# Patient Record
Sex: Male | Born: 1995 | Race: Black or African American | Hispanic: No | Marital: Married | Smoking: Current some day smoker
Health system: Southern US, Community
[De-identification: ages and names within clinical notes are randomized; demographics above are authoritative.]

## PROBLEM LIST (undated history)

## (undated) HISTORY — PX: HERNIA REPAIR: SHX51

---
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
## 2015-05-01 ENCOUNTER — Emergency Department (HOSPITAL_COMMUNITY)

## 2015-05-01 ENCOUNTER — Emergency Department (HOSPITAL_COMMUNITY)
Admission: EM | Admit: 2015-05-01 | Discharge: 2015-05-01 | Disposition: A | Discharge status: Home / Self Care | Attending: Emergency Medicine | Admitting: Emergency Medicine

## 2015-05-01 ENCOUNTER — Encounter (HOSPITAL_COMMUNITY): Payer: Self-pay | Admitting: Emergency Medicine

## 2015-05-01 DIAGNOSIS — L03211 Cellulitis of face: Secondary | ICD-10-CM | POA: Insufficient documentation

## 2015-05-01 DIAGNOSIS — R220 Localized swelling, mass and lump, head: Secondary | ICD-10-CM | POA: Diagnosis present

## 2015-05-01 LAB — CBC WITH DIFFERENTIAL/PLATELET
BASOS ABS: 0 10*3/uL (ref 0.0–0.1)
BASOS PCT: 0 %
Eosinophils Absolute: 0.2 10*3/uL (ref 0.0–0.7)
Eosinophils Relative: 3 %
HEMATOCRIT: 43.6 % (ref 39.0–52.0)
HEMOGLOBIN: 14.9 g/dL (ref 13.0–17.0)
LYMPHS PCT: 27 %
Lymphs Abs: 2.1 10*3/uL (ref 0.7–4.0)
MCH: 30.2 pg (ref 26.0–34.0)
MCHC: 34.2 g/dL (ref 30.0–36.0)
MCV: 88.4 fL (ref 78.0–100.0)
MONO ABS: 0.8 10*3/uL (ref 0.1–1.0)
MONOS PCT: 11 %
NEUTROS ABS: 4.6 10*3/uL (ref 1.7–7.7)
NEUTROS PCT: 59 %
PLATELETS: 280 10*3/uL (ref 150–400)
RBC: 4.93 MIL/uL (ref 4.22–5.81)
RDW: 12.3 % (ref 11.5–15.5)
WBC: 7.9 10*3/uL (ref 4.0–10.5)

## 2015-05-01 LAB — BASIC METABOLIC PANEL
ANION GAP: 9 (ref 5–15)
BUN: 14 mg/dL (ref 6–20)
CALCIUM: 9.6 mg/dL (ref 8.9–10.3)
CO2: 25 mmol/L (ref 22–32)
Chloride: 104 mmol/L (ref 101–111)
Creatinine, Ser: 1.14 mg/dL (ref 0.61–1.24)
GLUCOSE: 107 mg/dL — AB (ref 65–99)
POTASSIUM: 4 mmol/L (ref 3.5–5.1)
Sodium: 138 mmol/L (ref 135–145)

## 2015-05-01 MED ORDER — IOHEXOL 300 MG/ML  SOLN
100.0000 mL | Freq: Once | INTRAMUSCULAR | Status: AC | PRN
  Administered 2015-05-01: 100 mL via INTRAVENOUS

## 2015-05-01 MED ORDER — SODIUM CHLORIDE 0.9 % IV SOLN
INTRAVENOUS | Status: DC
  Administered 2015-05-01: 06:00:00 via INTRAVENOUS

## 2015-05-01 MED ORDER — CLINDAMYCIN PHOSPHATE 600 MG/50ML IV SOLN
600.0000 mg | Freq: Once | INTRAVENOUS | Status: AC
  Administered 2015-05-01: 600 mg via INTRAVENOUS
  Filled 2015-05-01: qty 50

## 2015-05-01 MED ORDER — CLINDAMYCIN HCL 300 MG PO CAPS
300.0000 mg | ORAL_CAPSULE | Freq: Four times a day (QID) | ORAL | Status: DC
Start: 2015-05-01 — End: 2020-06-09

## 2015-05-01 NOTE — Discharge Instructions (Signed)
Use heat on the area that is swollen. Take the antibiotics until gone. You can take ibuprofen 600 mg + acetaminophen 650 mg 4 times a day for pain or fever. Recheck if you aren't improving in the next 48 hrs or if you get worse (vomiting, high fever, trouble breathing or swallowing).    Cellulitis Cellulitis is an infection of the skin and the tissue under the skin. The infected area is usually red and tender. This happens most often in the arms and lower legs. HOME CARE   Take your antibiotic medicine as told. Finish the medicine even if you start to feel better.  Keep the infected arm or leg raised (elevated).  Put a warm cloth on the area up to 4 times per day.  Only take medicines as told by your doctor.  Keep all doctor visits as told. GET HELP IF:  You see red streaks on the skin coming from the infected area.  Your red area gets bigger or turns a dark color.  Your bone or joint under the infected area is painful after the skin heals.  Your infection comes back in the same area or different area.  You have a puffy (swollen) bump in the infected area.  You have new symptoms.  You have a fever. GET HELP RIGHT AWAY IF:   You feel very sleepy.  You throw up (vomit) or have watery poop (diarrhea).  You feel sick and have muscle aches and pains.   This information is not intended to replace advice given to you by your health care provider. Make sure you discuss any questions you have with your health care provider.   Document Released: 11/19/2007 Document Revised: 02/21/2015 Document Reviewed: 08/18/2011 Elsevier Interactive Patient Education [employer]2016 Elsevier Inc.

## 2015-05-01 NOTE — ED Notes (Signed)
Pt has an abscess to the left side of his face causing swelling around his left eye  Pt states it started 2 days ago and is getting worse

## 2015-05-01 NOTE — ED Notes (Signed)
MD at bedside. 

## 2015-05-01 NOTE — ED Notes (Signed)
Bed: UX32WA05 Expected date:  Expected time:  Means of arrival:  Comments: Magar

## 2015-05-01 NOTE — ED Provider Notes (Signed)
CSN: [id number]     Arrival date & time 05/01/15  0415 History   First MD Initiated Contact with Patient 05/01/15 0530     Chief Complaint  Patient presents with  . Facial Swelling     (Consider location/radiation/quality/duration/timing/severity/associated sxs/prior Treatment) HPI  Patient reports 3-4 days ago he had a small pimple on the left side of his face that has gotten progressively worse. He states at first he thought it was insect bite but denies that it has not had any itching. He states his morning he started having a lot of swelling involving his eye and states it's painful when he moves his eyes. He is unsure of fever and has a slight headache. He denies any prior history of boils or abscesses. He denies anybody in his family having a boil or abscess.  PCP Dr Marlyne Beards  History reviewed. No pertinent past medical history. Past Surgical History  Procedure Laterality Date  . Hernia repair     Family History  Problem Relation Age of Onset  . Hypertension Other   . Diabetes Other   . CAD Other    Social History  Substance Use Topics  . Smoking status: Never Smoker   . Smokeless tobacco: Never Used  . Alcohol Use: No  employed  Review of Systems  All other systems reviewed and are negative.     Allergies  Review of patient's allergies indicates no known allergies.  Home Medications   Prior to Admission medications   Medication Sig Start Date End Date Taking? Authorizing Provider  Witch Hazel 14 % LIQD Apply 1 application topically once.   Yes Historical Provider, MD  azithromycin (ZITHROMAX) 250 MG tablet Take 1 tablet (250 mg total) by mouth daily. Take first 2 tablets together, then 1 every day until finished. Patient not taking: Reported on 05/01/2015 06/15/14   [name], PA-C  clindamycin (CLEOCIN) 300 MG capsule Take 1 capsule (300 mg total) by mouth 4 (four) times daily. 05/01/15   [name], MD  guaiFENesin-codeine 100-10 MG/5ML syrup Take 5-10  mLs by mouth 3 (three) times daily as needed for cough. Patient not taking: Reported on 05/01/2015 06/15/14   [name], PA-C  ibuprofen (ADVIL,MOTRIN) 600 MG tablet Take 1 tablet (600 mg total) by mouth every 6 (six) hours as needed for mild pain. Patient not taking: Reported on 05/01/2015 09/25/13   Marcellina Millin, MD  phenol (CHLORASEPTIC) 1.4 % LIQD Use as directed 1 spray in the mouth or throat as needed for throat irritation / pain. Patient not taking: Reported on 05/01/2015 10/25/14   [name], PA-C   BP 109/70 mmHg  Pulse 62  Temp(Src) 98.1 F (36.7 C) (Oral)  Resp 20  SpO2 100%    Vital signs normal   Physical Exam  Constitutional: He is oriented to person, place, and time. He appears well-developed and well-nourished.  Non-toxic appearance. He does not appear ill. No distress.  HENT:  Head: Normocephalic and atraumatic.  Right Ear: External ear normal.  Left Ear: External ear normal.  Nose: Nose normal. No mucosal edema or rhinorrhea.  Mouth/Throat: Oropharynx is clear and moist and mucous membranes are normal. No dental abscesses or uvula swelling.  Patient is noted to have moderate swelling on the left lateral portion of his face that extends to his left upper and more on his left lower eyelids. We do range of motion of his eyes he states it causes pain. The tissue underneath the swelling is firm to palpation.  Eyes: Conjunctivae and EOM are normal.  Neck: Normal range of motion and full passive range of motion without pain.  Pulmonary/Chest: Effort normal. No respiratory distress. He has no rhonchi. He exhibits no crepitus.  Abdominal: Normal appearance.  Musculoskeletal: Normal range of motion.  Moves all extremities well.   Neurological: He is alert and oriented to person, place, and time. He has normal strength. No cranial nerve deficit.  Skin: Skin is warm, dry and intact. No rash noted. No erythema. No pallor.  Psychiatric: He has a normal mood and affect.  His speech is normal and behavior is normal. His mood appears not anxious.  Nursing note and vitals reviewed.        ED Course  Procedures (including critical care time)   Medications  0.9 %  sodium chloride infusion ( Intravenous New Bag/Given 05/01/15 0612)  clindamycin (CLEOCIN) IVPB 600 mg (not administered)  iohexol (OMNIPAQUE) 300 MG/ML solution 100 mL (100 mLs Intravenous Contrast Given 05/01/15 0633)    CT of patient's face was done to look for abscess. Also concerned about. Orbital cellulitis. Patient refused pain medication at this time.  7:40 AM patient's CT scan was reviewed. He was started on IV clindamycin for his facial cellulitis.   Labs Review Results for orders placed or performed during the hospital encounter of 05/01/15  Basic metabolic panel  Result Value Ref Range   Sodium 138 135 - 145 mmol/L   Potassium 4.0 3.5 - 5.1 mmol/L   Chloride 104 101 - 111 mmol/L   CO2 25 22 - 32 mmol/L   Glucose, Bld 107 (H) 65 - 99 mg/dL   BUN 14 6 - 20 mg/dL   Creatinine, Ser 1.61 0.61 - 1.24 mg/dL   Calcium 9.6 8.9 - 09.6 mg/dL   GFR calc non Af Amer >60 >60 mL/min   GFR calc Af Amer >60 >60 mL/min   Anion gap 9 5 - 15  CBC with Differential  Result Value Ref Range   WBC 7.9 4.0 - 10.5 K/uL   RBC 4.93 4.22 - 5.81 MIL/uL   Hemoglobin 14.9 13.0 - 17.0 g/dL   HCT 04.5 40.9 - 81.1 %   MCV 88.4 78.0 - 100.0 fL   MCH 30.2 26.0 - 34.0 pg   MCHC 34.2 30.0 - 36.0 g/dL   RDW 91.4 78.2 - 95.6 %   Platelets 280 150 - 400 K/uL   Neutrophils Relative % 59 %   Neutro Abs 4.6 1.7 - 7.7 K/uL   Lymphocytes Relative 27 %   Lymphs Abs 2.1 0.7 - 4.0 K/uL   Monocytes Relative 11 %   Monocytes Absolute 0.8 0.1 - 1.0 K/uL   Eosinophils Relative 3 %   Eosinophils Absolute 0.2 0.0 - 0.7 K/uL   Basophils Relative 0 %   Basophils Absolute 0.0 0.0 - 0.1 K/uL   Laboratory interpretation all normal     Imaging Review Ct Maxillofacial W/cm  05/01/2015  CLINICAL DATA:   19 year old male with left-sided facial swelling for the past 2 days. Eye pain, increasing with movement. EXAM: CT MAXILLOFACIAL WITH CONTRAST TECHNIQUE: Multidetector CT imaging of the maxillofacial structures was performed with intravenous contrast. Multiplanar CT image reconstructions were also generated. A small metallic BB was placed on the right temple in order to reliably differentiate right from left. CONTRAST:  OMNIPAQUE IOHEXOL 300 MG/ML  SOLN COMPARISON:  No priors. FINDINGS: Extensive soft tissue swelling is noted overlying the left zygomatic arch. The skin and subcutaneous fat appear  diffusely thickened throughout this region measuring up to 1.2 cm in thickness (compared with only 4 mm on the contralateral side). No discrete focal fluid collection is identified to suggest an underlying abscess at this time. The deep soft tissues of the face are normal in appearance. Specifically, no gas is identified within the deep soft tissues of the face, or in the area of thickening of the subcutaneous fat, to suggest underlying fasciitis at this time. Bilateral globes and retro-orbital soft tissues are grossly normal in appearance. No lymphadenopathy is noted in the visualized portions of the head or neck. Visualized intracranial contents are normal in appearance. No acute displaced facial bone fractures are noted. Specifically, specifically, pterygoid plates are intact. Mandible is intact, and the mandibular condyles are located bilaterally. Both zygomatic arches are intact. Visualized portions of the paranasal sinuses and mastoids are well pneumatized. IMPRESSION: 1. Extensive thickening of the scan and subcutaneous fat overlying the left zygomatic arch presumably represents cellulitis. No evidence of underlying abscess, and no soft tissue gas to suggest underlying fasciitis at this time. Electronically Signed   By: Trudie Reedaniel  Entrikin M.D.   On: 05/01/2015 07:14   I have personally reviewed and evaluated  these images and lab results as part of my medical decision-making.    MDM   Final diagnoses:  Facial cellulitis    New Prescriptions   CLINDAMYCIN (CLEOCIN) 300 MG CAPSULE    Take 1 capsule (300 mg total) by mouth 4 (four) times daily.    Plan discharge   Devoria AlbeIva Lawerence Dery, MD, Concha PyoFACEP     Braeton Wolgamott, MD 05/01/15 [phone]

## 2016-06-25 ENCOUNTER — Encounter (HOSPITAL_COMMUNITY): Payer: Self-pay | Admitting: Emergency Medicine

## 2016-06-25 ENCOUNTER — Emergency Department (HOSPITAL_COMMUNITY)
Admission: EM | Admit: 2016-06-25 | Discharge: 2016-06-25 | Disposition: A | Discharge status: Home / Self Care | Attending: Physician Assistant | Admitting: Physician Assistant

## 2016-06-25 ENCOUNTER — Emergency Department (HOSPITAL_COMMUNITY)

## 2016-06-25 DIAGNOSIS — B9789 Other viral agents as the cause of diseases classified elsewhere: Secondary | ICD-10-CM

## 2016-06-25 DIAGNOSIS — J069 Acute upper respiratory infection, unspecified: Secondary | ICD-10-CM | POA: Insufficient documentation

## 2016-06-25 DIAGNOSIS — J029 Acute pharyngitis, unspecified: Secondary | ICD-10-CM | POA: Diagnosis present

## 2016-06-25 MED ORDER — BENZONATATE 100 MG PO CAPS: 100.0000 mg | ORAL_CAPSULE | Freq: Three times a day (TID) | ORAL | 0 refills | Status: DC

## 2016-06-25 NOTE — Discharge Instructions (Signed)
Please read attached information. If you experience any new or worsening signs or symptoms please return to the emergency room for evaluation. Please follow-up with your primary care provider or specialist as discussed. Please use medication prescribed only as directed and discontinue taking if you have any concerning signs or symptoms.   °

## 2016-06-25 NOTE — ED Triage Notes (Addendum)
Patient reports sore throat and congestion x 2 weeks.  Reports productive cough and headaches, denies fever.  States taking sudafed and tylenol cold and flu without relief.

## 2016-06-25 NOTE — ED Notes (Signed)
ED Provider at bedside. 

## 2016-06-25 NOTE — ED Provider Notes (Signed)
[facility]-EMERGENCY DEPT Provider Note   CSN: [id number] Arrival date & time: 06/25/16  1053  By signing my name below, I, Vista Mink, attest that this documentation has been prepared under the direction and in the presence of [employer].  Electronically Signed: Vista Mink, ED Scribe. 06/25/16. 10:59 AM.  History   Chief Complaint Chief Complaint  Patient presents with  . Sore Throat   HPI HPI Comments: Philip Baird is a 21 y.o. male, with no significant Hx, who presents to the Emergency Department complaining of worsening sore throat with associated rhinorrhea and congestion that started two weeks ago. He also reports some mild pain in his sinuses and a productive cough. Pt also notes intermittent central chest pain brought on when coughing but also states that this occurs randomly. He has taken Tylenol and Sudafed with no relief. Not a current smoker. He denies any lower extremity swelling. No ear pain. No shortness of breath or fever.   The history is provided by the patient. No language interpreter was used.    History reviewed. No pertinent past medical history.  There are no active problems to display for this patient.   Past Surgical History:  Procedure Laterality Date  . HERNIA REPAIR      Home Medications    Prior to Admission medications   Medication Sig Start Date End Date Taking? Authorizing Provider  azithromycin (ZITHROMAX) 250 MG tablet Take 1 tablet (250 mg total) by mouth daily. Take first 2 tablets together, then 1 every day until finished. Patient not taking: Reported on 05/01/2015 06/15/14   [name], PA-C  benzonatate (TESSALON) 100 MG capsule Take 1 capsule (100 mg total) by mouth every 8 (eight) hours. 06/25/16   Eyvonne Mechanic, PA-C  clindamycin (CLEOCIN) 300 MG capsule Take 1 capsule (300 mg total) by mouth 4 (four) times daily. 05/01/15   [name], MD  guaiFENesin-codeine 100-10 MG/5ML syrup Take 5-10 mLs by mouth 3 (three) times daily  as needed for cough. Patient not taking: Reported on 05/01/2015 06/15/14   [name], PA-C  ibuprofen (ADVIL,MOTRIN) 600 MG tablet Take 1 tablet (600 mg total) by mouth every 6 (six) hours as needed for mild pain. Patient not taking: Reported on 05/01/2015 09/25/13   Marcellina Millin, MD  phenol (CHLORASEPTIC) 1.4 % LIQD Use as directed 1 spray in the mouth or throat as needed for throat irritation / pain. Patient not taking: Reported on 05/01/2015 10/25/14   [name], PA-C  Witch Hazel 14 % LIQD Apply 1 application topically once.    Historical Provider, MD   Family History Family History  Problem Relation Age of Onset  . Hypertension Other   . Diabetes Other   . CAD Other    Social History Social History  Substance Use Topics  . Smoking status: Never Smoker  . Smokeless tobacco: Never Used  . Alcohol use No   Allergies   Patient has no known allergies.   Review of Systems Review of Systems A complete 10 system review of systems was obtained and all systems are negative except as noted in the HPI and PMH.    Physical Exam Updated Vital Signs BP 132/85 (BP Location: Left Arm)   Pulse 64   Temp 98.2 F (36.8 C) (Oral)   Resp 18   Ht 5' 11.5" (1.816 m)   Wt 210 lb (95.3 kg)   SpO2 97%   BMI 28.88 kg/m   Physical Exam  Constitutional: He is oriented to person,  place, and time. He appears well-developed and well-nourished. No distress.  HENT:  Head: Normocephalic and atraumatic.  Right Ear: Tympanic membrane, external ear and ear canal normal.  Left Ear: Tympanic membrane, external ear and ear canal normal.  Mouth/Throat: Oropharynx is clear and moist. No oropharyngeal exudate.  Rhinorrhea.   Neck: Normal range of motion.  Pulmonary/Chest: Effort normal and breath sounds normal.  Lymphadenopathy:    He has no cervical adenopathy.  Neurological: He is alert and oriented to person, place, and time.  Skin: Skin is warm and dry. He is not diaphoretic.    Psychiatric: He has a normal mood and affect. Judgment normal.  Nursing note and vitals reviewed.   ED Treatments / Results  DIAGNOSTIC STUDIES: Oxygen Saturation is 97% on RA, normal by my interpretation.  COORDINATION OF CARE: 11:17 AM-Discussed treatment plan with pt at bedside and pt agreed to plan.   Labs (all labs ordered are listed, but only abnormal results are displayed) Labs Reviewed - No data to display  EKG  EKG Interpretation None      Radiology Dg Chest 2 View  Result Date: 06/25/2016 CLINICAL DATA:  Cough, congestion and mid chest pain for 2 weeks. EXAM: CHEST  2 VIEW COMPARISON:  09/09/2012 FINDINGS: The heart size and mediastinal contours are within normal limits. Both lungs are clear. The visualized skeletal structures are unremarkable. IMPRESSION: No active cardiopulmonary disease. Electronically Signed   By: Charlett NoseKevin  Dover M.D.   On: 06/25/2016 12:26    Procedures Procedures (including critical care time)  Medications Ordered in ED Medications - No data to display   Initial Impression / Assessment and Plan / ED Course  I have reviewed the triage vital signs and the nursing notes.  Pertinent labs & imaging results that were available during my care of the patient were reviewed by me and considered in my medical decision making (see chart for details).  Clinical Course     Patient presents with likely viral URI. He has no signs of acute bacterial infection. He will be treated with cough medication, symptomatic care instructions and strict return precautions. He verbalizes understanding and agreement to today's plan had no further questions or concerns.  Final Clinical Impressions(s) / ED Diagnoses   Final diagnoses:  Viral URI with cough    New Prescriptions New Prescriptions   BENZONATATE (TESSALON) 100 MG CAPSULE    Take 1 capsule (100 mg total) by mouth every 8 (eight) hours.    I personally performed the services described in this  documentation, which was scribed in my presence. The recorded information has been reviewed and is accurate.     Eyvonne MechanicJeffrey Anvay Tennis, PA-C 06/25/16 1300    Courteney Randall AnLyn Mackuen, MD 06/25/16 1639

## 2016-07-08 ENCOUNTER — Emergency Department (HOSPITAL_COMMUNITY)
Admission: EM | Admit: 2016-07-08 | Discharge: 2016-07-08 | Disposition: A | Discharge status: Home / Self Care | Attending: Emergency Medicine | Admitting: Emergency Medicine

## 2016-07-08 ENCOUNTER — Encounter (HOSPITAL_COMMUNITY): Payer: Self-pay | Admitting: Emergency Medicine

## 2016-07-08 DIAGNOSIS — Y999 Unspecified external cause status: Secondary | ICD-10-CM | POA: Insufficient documentation

## 2016-07-08 DIAGNOSIS — M545 Low back pain, unspecified: Secondary | ICD-10-CM

## 2016-07-08 DIAGNOSIS — X58XXXA Exposure to other specified factors, initial encounter: Secondary | ICD-10-CM | POA: Insufficient documentation

## 2016-07-08 DIAGNOSIS — Y9367 Activity, basketball: Secondary | ICD-10-CM | POA: Diagnosis not present

## 2016-07-08 DIAGNOSIS — Y929 Unspecified place or not applicable: Secondary | ICD-10-CM | POA: Insufficient documentation

## 2016-07-08 NOTE — Discharge Instructions (Signed)
Please read attached information. If you experience any new or worsening signs or symptoms please return to the emergency room for evaluation. Please follow-up with your primary care provider or specialist as discussed.  °

## 2016-07-08 NOTE — ED Provider Notes (Signed)
[facility] Provider Note   CSN: [id number] Arrival date & time: 07/08/16  1450  By signing my name below, I, Philip Baird, attest that this documentation has been prepared under the direction and in the presence of [employer], PA-C . Electronically Signed: Majel Baird, Scribe. 07/08/2016. 5:47 PM.  History   Chief Complaint Chief Complaint  Patient presents with  . Back Pain   The history is provided by the patient. No language interpreter was used.   HPI Comments: Philip Baird is a 21 y.o. male who presents to the Emergency Department complaining of gradually worsening, "aching" lower back pain that began this afternoon. Pt reports he was playing basketball this afternoon and noticed pain in his lower back soon afterwards. He denies any injury or trauma to his back, numbness or weakness in his extremities, and fever.   History reviewed. No pertinent past medical history.  There are no active problems to display for this patient.  Past Surgical History:  Procedure Laterality Date  . HERNIA REPAIR      Home Medications    Prior to Admission medications   Medication Sig Start Date End Date Taking? Authorizing Provider  azithromycin (ZITHROMAX) 250 MG tablet Take 1 tablet (250 mg total) by mouth daily. Take first 2 tablets together, then 1 every day until finished. Patient not taking: Reported on 05/01/2015 06/15/14   [name], PA-C  benzonatate (TESSALON) 100 MG capsule Take 1 capsule (100 mg total) by mouth every 8 (eight) hours. 06/25/16   Eyvonne Mechanic, PA-C  clindamycin (CLEOCIN) 300 MG capsule Take 1 capsule (300 mg total) by mouth 4 (four) times daily. 05/01/15   [name], MD  guaiFENesin-codeine 100-10 MG/5ML syrup Take 5-10 mLs by mouth 3 (three) times daily as needed for cough. Patient not taking: Reported on 05/01/2015 06/15/14   [name], PA-C  ibuprofen (ADVIL,MOTRIN) 600 MG tablet Take 1 tablet (600 mg total) by mouth every 6 (six) hours as  needed for mild pain. Patient not taking: Reported on 05/01/2015 09/25/13   Marcellina Millin, MD  phenol (CHLORASEPTIC) 1.4 % LIQD Use as directed 1 spray in the mouth or throat as needed for throat irritation / pain. Patient not taking: Reported on 05/01/2015 10/25/14   [name], PA-C  Witch Hazel 14 % LIQD Apply 1 application topically once.    Historical Provider, MD    Family History Family History  Problem Relation Age of Onset  . Hypertension Other   . Diabetes Other   . CAD Other     Social History Social History  Substance Use Topics  . Smoking status: Never Smoker  . Smokeless tobacco: Never Used  . Alcohol use No   Allergies   Patient has no known allergies.  Review of Systems Review of Systems  Constitutional: Negative for fever.  Musculoskeletal: Positive for back pain.  Neurological: Negative for weakness and numbness.   Physical Exam Updated Vital Signs BP 117/80 (BP Location: Left Arm)   Pulse 99   Temp 98 F (36.7 C) (Oral)   Resp 18   SpO2 99%   Physical Exam  Constitutional: He is oriented to person, place, and time. He appears well-developed and well-nourished.  HENT:  Head: Normocephalic.  Eyes: EOM are normal.  Neck: Normal range of motion.  Pulmonary/Chest: Effort normal.  Abdominal: He exhibits no distension.  Musculoskeletal: Normal range of motion.  No CT or L-spine tenderness, back atraumatic, no tenderness palpation. Full active strength of upper and lower extremities,  strength intact.  Neurological: He is alert and oriented to person, place, and time.  Psychiatric: He has a normal mood and affect.  Nursing note and vitals reviewed.  ED Treatments / Results  DIAGNOSTIC STUDIES:  Oxygen Saturation is 99% on RA, normal by my interpretation.    COORDINATION OF CARE:  4:51 PM Discussed treatment plan with pt at bedside and pt agreed to plan.  Radiology No results found.  Procedures Procedures (including critical care  time)  Medications Ordered in ED Medications - No data to display  Initial Impression / Assessment and Plan / ED Course  I have reviewed the triage vital signs and the nursing notes.  Pertinent labs & imaging results that were available during my care of the patient were reviewed by me and considered in my medical decision making (see chart for details).  Labs:   Imaging:   Consults:   Therapeutics:   Discharge Meds:  Assessment/Plan:   21 year old male with unconjugated back pain. No neurological deficits. Patient given symptomatic care instructions return precautions.  I personally performed the services described in this documentation, which was scribed in my presence. The recorded information has been reviewed and is accurate..   Final Clinical Impressions(s) / ED Diagnoses   Final diagnoses:  Acute bilateral low back pain without sciatica    New Prescriptions New Prescriptions   No medications on file     Eyvonne MechanicJeffrey Corben Auzenne, PA-C 07/08/16 1747    Shaune Pollackameron Isaacs, MD 07/10/16 [phone]

## 2016-07-08 NOTE — ED Triage Notes (Signed)
Patient reports worsening lower back pain after playing basketball today. Denies fall/ injury. Ambulatory to triage. Denies numbness/tingling, bowel/bladder changes.

## 2017-01-28 ENCOUNTER — Encounter (HOSPITAL_COMMUNITY): Payer: Self-pay | Admitting: Emergency Medicine

## 2017-01-28 ENCOUNTER — Emergency Department (HOSPITAL_COMMUNITY)
Admission: EM | Admit: 2017-01-28 | Discharge: 2017-01-28 | Disposition: A | Discharge status: Home / Self Care | Attending: Emergency Medicine | Admitting: Emergency Medicine

## 2017-01-28 ENCOUNTER — Emergency Department (HOSPITAL_COMMUNITY)

## 2017-01-28 DIAGNOSIS — R0789 Other chest pain: Secondary | ICD-10-CM | POA: Diagnosis not present

## 2017-01-28 LAB — BASIC METABOLIC PANEL
Anion gap: 8 (ref 5–15)
BUN: 15 mg/dL (ref 6–20)
CHLORIDE: 102 mmol/L (ref 101–111)
CO2: 28 mmol/L (ref 22–32)
CREATININE: 1.17 mg/dL (ref 0.61–1.24)
Calcium: 9.5 mg/dL (ref 8.9–10.3)
Glucose, Bld: 94 mg/dL (ref 65–99)
POTASSIUM: 4.3 mmol/L (ref 3.5–5.1)
SODIUM: 138 mmol/L (ref 135–145)

## 2017-01-28 LAB — CBC
HEMATOCRIT: 44.1 % (ref 39.0–52.0)
Hemoglobin: 15.2 g/dL (ref 13.0–17.0)
MCH: 29.6 pg (ref 26.0–34.0)
MCHC: 34.5 g/dL (ref 30.0–36.0)
MCV: 85.8 fL (ref 78.0–100.0)
PLATELETS: 281 10*3/uL (ref 150–400)
RBC: 5.14 MIL/uL (ref 4.22–5.81)
RDW: 12.7 % (ref 11.5–15.5)
WBC: 5 10*3/uL (ref 4.0–10.5)

## 2017-01-28 LAB — POCT I-STAT TROPONIN I: Troponin i, poc: 0 ng/mL (ref 0.00–0.08)

## 2017-01-28 MED ORDER — IBUPROFEN 800 MG PO TABS: 800.0000 mg | ORAL_TABLET | Freq: Three times a day (TID) | ORAL | 0 refills | Status: DC

## 2017-01-28 NOTE — Discharge Instructions (Signed)
Please read attached information. If you experience any new or worsening signs or symptoms please return to the emergency room for evaluation. Please follow-up with your primary care provider or specialist as discussed. Please use medication prescribed only as directed and discontinue taking if you have any concerning signs or symptoms.   °

## 2017-01-28 NOTE — ED Notes (Signed)
Pt ambulatory and independent at discharge.  Verbalized understanding of discharge instructions 

## 2017-01-28 NOTE — ED Triage Notes (Signed)
Pt c/o intermittent sharp central and left chest pain onset last night. No SOB, dizziness, nausea, blurred vision, arm/neck pain or tingling. No aggravating or alleviating factors. Not related to activity or ROM. Not reproducible with palpation or ROM.

## 2017-01-28 NOTE — ED Provider Notes (Signed)
[facility] DEPT Provider Note   CSN: [id number] Arrival date & time: 01/28/17  1145     History   Chief Complaint Chief Complaint  Patient presents with  . Chest Pain    HPI Philip Baird is a 21 y.o. male.  HPI   21 year old male presents today with complaints of chest pain.  Patient notes since Monday he has had sharp intermittent chest pain.  He notes this is in his sternum with radiation to the left side.  He notes this comes out of the blue lasts approximately 30 seconds and then resolves without comp occasion.  He denies any associated shortness of breath, nausea, abdominal pain, dizziness.  Patient denies any preceding illnesses including fever, or any other infectious etiology.  Patient reports that symptoms are not made worse with any movements including deep breaths or palpation of the chest.  Patient reports he had one episode prior to my evaluation, but the time of evaluation is completely asymptomatic.  Patient denies any risk factors for DVT or PE.  He reports he smokes a vaporized pen.  No known cardiac history in himself or close family.  History reviewed. No pertinent past medical history.  There are no active problems to display for this patient.   Past Surgical History:  Procedure Laterality Date  . HERNIA REPAIR       Home Medications    Prior to Admission medications   Medication Sig Start Date End Date Taking? Authorizing Provider  azithromycin (ZITHROMAX) 250 MG tablet Take 1 tablet (250 mg total) by mouth daily. Take first 2 tablets together, then 1 every day until finished. Patient not taking: Reported on 05/01/2015 06/15/14   [name], PA-C  benzonatate (TESSALON) 100 MG capsule Take 1 capsule (100 mg total) by mouth every 8 (eight) hours. 06/25/16   Jylan Loeza, Tinnie Gens, PA-C  clindamycin (CLEOCIN) 300 MG capsule Take 1 capsule (300 mg total) by mouth 4 (four) times daily. 05/01/15   [name], MD  guaiFENesin-codeine 100-10 MG/5ML syrup Take  5-10 mLs by mouth 3 (three) times daily as needed for cough. Patient not taking: Reported on 05/01/2015 06/15/14   [name], PA-C  ibuprofen (ADVIL,MOTRIN) 800 MG tablet Take 1 tablet (800 mg total) by mouth 3 (three) times daily. 01/28/17   Ernest Orr, Tinnie Gens, PA-C  phenol (CHLORASEPTIC) 1.4 % LIQD Use as directed 1 spray in the mouth or throat as needed for throat irritation / pain. Patient not taking: Reported on 05/01/2015 10/25/14   [name], PA-C  Witch Hazel 14 % LIQD Apply 1 application topically once.    [provider]    Family History Family History  Problem Relation Age of Onset  . Hypertension Other   . Diabetes Other   . CAD Other     Social History Social History  Substance Use Topics  . Smoking status: Never Smoker  . Smokeless tobacco: Never Used  . Alcohol use No     Allergies   Patient has no known allergies.   Review of Systems Review of Systems  All other systems reviewed and are negative.    Physical Exam Updated Vital Signs BP 128/83 (BP Location: Right Arm)   Pulse 62   Temp 98.1 F (36.7 C) (Oral)   Resp 16   SpO2 100%   Physical Exam  Constitutional: He is oriented to person, place, and time. He appears well-developed and well-nourished.  HENT:  Head: Normocephalic and atraumatic.  Eyes: Pupils are equal, round, and reactive to  light. Conjunctivae are normal. Right eye exhibits no discharge. Left eye exhibits no discharge. No scleral icterus.  Neck: Normal range of motion. No JVD present. No tracheal deviation present.  Cardiovascular: Normal rate, regular rhythm and normal heart sounds.  Exam reveals no gallop and no friction rub.   No murmur heard. Pulmonary/Chest: Effort normal and breath sounds normal. No stridor. No respiratory distress. He has no wheezes. He has no rales.  Abdominal: Soft. He exhibits no distension and no mass. There is no tenderness. There is no rebound and no guarding. No hernia.    Musculoskeletal: He exhibits no edema.  Neurological: He is alert and oriented to person, place, and time. Coordination normal.  Psychiatric: He has a normal mood and affect. His behavior is normal. Judgment and thought content normal.  Nursing note and vitals reviewed.   ED Treatments / Results  Labs (all labs ordered are listed, but only abnormal results are displayed) Labs Reviewed  BASIC METABOLIC PANEL  CBC  I-STAT TROPONIN, ED  POCT I-STAT TROPONIN I    EKG  EKG Interpretation  Date/Time:  Wednesday January 28 2017 12:14:24 EDT Ventricular Rate:  62 PR Interval:    QRS Duration: 99 QT Interval:  380 QTC Calculation: 386 R Axis:   73 Text Interpretation:  Sinus rhythm ST elevation suggests acute pericarditis Baseline wander in lead(s) V3 V4 No old tracing to compare Confirmed by Mancel Bale [phone]) on 01/28/2017 12:46:27 PM       Radiology Dg Chest 2 View  Result Date: 01/28/2017 CLINICAL DATA:  Mid to left-sided chest pain for the past week. EXAM: CHEST  2 VIEW COMPARISON:  PA and lateral chest x-ray of June 25, 2016 FINDINGS: The lungs are well-expanded and clear. The heart and pulmonary vascularity are normal. The mediastinum is normal in width. There is no pleural effusion or pneumothorax. The bony thorax exhibits no acute abnormality. IMPRESSION: There is no acute cardiopulmonary abnormality. Electronically Signed   By: David  [place] M.D.   On: 01/28/2017 13:01    Procedures Procedures (including critical care time)  Medications Ordered in ED Medications - No data to display   Initial Impression / Assessment and Plan / ED Course  I have reviewed the triage vital signs and the nursing notes.  Pertinent labs & imaging results that were available during my care of the patient were reviewed by me and considered in my medical decision making (see chart for details).     Final Clinical Impressions(s) / ED Diagnoses   Final diagnoses:  Atypical chest  pain    Labs: I-STAT troponin BMP, CBC  Imaging: Chest 2 view  Consults:  Therapeutics:  Discharge Meds:  ibuprofen  Assessment/Plan: 21 year old male presents today with nonspecific chest pain.  Patient is asymptomatic during my evaluation.  He is PERC negative, has a heart score of 1, have very low suspicion for PE, ACS, or any significant intrathoracic pathology.  Patient has nonspecific findings on his EKG, question pericarditis.  Patient is very well-appearing, no hemodynamic compromise, normal work.  Asymptomatic.  He will be placed on anti-inflammatories, referred to cardiology for outpatient follow-up.  He will return to the emergency room if any new or worsening signs or symptoms present.  He verbalized understanding and agreement to today's plan had no further questions or concerns at time discharge.    New Prescriptions New Prescriptions   IBUPROFEN (ADVIL,MOTRIN) 800 MG TABLET    Take 1 tablet (800 mg total) by mouth 3 (three) times daily.  Eyvonne MechanicHedges, Anushree Dorsi, PA-C 01/28/17 1358    Mancel BaleWentz, Elliott, MD 01/30/17 1124

## 2019-06-11 ENCOUNTER — Emergency Department (HOSPITAL_BASED_OUTPATIENT_CLINIC_OR_DEPARTMENT_OTHER)

## 2019-06-11 ENCOUNTER — Encounter (HOSPITAL_BASED_OUTPATIENT_CLINIC_OR_DEPARTMENT_OTHER): Payer: Self-pay | Admitting: Emergency Medicine

## 2019-06-11 ENCOUNTER — Emergency Department (HOSPITAL_BASED_OUTPATIENT_CLINIC_OR_DEPARTMENT_OTHER)
Admission: EM | Admit: 2019-06-11 | Discharge: 2019-06-12 | Disposition: A | Discharge status: Home / Self Care | Attending: Emergency Medicine | Admitting: Emergency Medicine

## 2019-06-11 ENCOUNTER — Other Ambulatory Visit: Payer: Self-pay

## 2019-06-11 ENCOUNTER — Encounter (HOSPITAL_COMMUNITY): Payer: Self-pay

## 2019-06-11 ENCOUNTER — Emergency Department (HOSPITAL_COMMUNITY): Payer: Self-pay

## 2019-06-11 ENCOUNTER — Emergency Department (HOSPITAL_COMMUNITY)
Admission: EM | Admit: 2019-06-11 | Discharge: 2019-06-11 | Disposition: A | Discharge status: Home / Self Care | Payer: Self-pay | Attending: Emergency Medicine | Admitting: Emergency Medicine

## 2019-06-11 DIAGNOSIS — I861 Scrotal varices: Secondary | ICD-10-CM | POA: Insufficient documentation

## 2019-06-11 DIAGNOSIS — N50812 Left testicular pain: Secondary | ICD-10-CM | POA: Diagnosis not present

## 2019-06-11 DIAGNOSIS — N50811 Right testicular pain: Secondary | ICD-10-CM | POA: Diagnosis present

## 2019-06-11 DIAGNOSIS — N50819 Testicular pain, unspecified: Secondary | ICD-10-CM

## 2019-06-11 DIAGNOSIS — Z5321 Procedure and treatment not carried out due to patient leaving prior to being seen by health care provider: Secondary | ICD-10-CM | POA: Insufficient documentation

## 2019-06-11 LAB — URINALYSIS, ROUTINE W REFLEX MICROSCOPIC
Bilirubin Urine: NEGATIVE
Glucose, UA: NEGATIVE mg/dL
Hgb urine dipstick: NEGATIVE
Ketones, ur: NEGATIVE mg/dL
Leukocytes,Ua: NEGATIVE
Nitrite: NEGATIVE
Protein, ur: NEGATIVE mg/dL
Specific Gravity, Urine: >1.03 — ABNORMAL HIGH (ref 1.005–1.030)
pH: 6.5 (ref 5.0–8.0)

## 2019-06-11 NOTE — ED Triage Notes (Signed)
Pt reports testicular pain progressive since Wednesday. States intermittent pain with constant pain today. Denies urinary symptoms. Seen at urgent care and sent to [facility] for ultrasound,. He left without being seen and then went home and took a nap. Denies trauma to area.

## 2019-06-11 NOTE — ED Triage Notes (Signed)
Pt was sent from UC for testicular pain. UC was concerned for torsion. UA was done there, which came back unremarkable.  Pt states he has had the pain, increasing since Wednesday.

## 2019-06-11 NOTE — Discharge Instructions (Signed)
Ultrasound of your testicles showed no acute abnormality or cause of pain.  You do have a small left varicocele but this would not cause discomfort.  Your urine showed no sign of infection today.  I recommend follow-up with your primary care physician if symptoms continue.  Please return to the emergency department if you have worsening pain, swelling, redness or bruising to your scrotum, fever of 100.4 or higher, unable to urinate.   You may alternate Tylenol 1000 mg every 6 hours as needed for pain, fever and Ibuprofen 800 mg every 8 hours as needed for pain, fever.  Please take Ibuprofen with food.  Do not take more than 4000 mg of Tylenol (acetaminophen) in a 24 hour period.    Steps to find a Primary Care Provider (PCP):  Call [phone] or [phone] to access "[facility]."  2.  You may also go on the [facility] website at [email or web address]  3.  [facility] also frequently accepts new patients.  Eagar Olin [phone]  4.  There are also multiple [facility], [facility] and [facility] practices throughout the [place] that are frequently accepting new patients. You may find a clinic that is close to your home and contact them.  [email or web address] [phone]  [facility] [email or web address]  [email or web address] [place] [email or web address] [phone]  5.  Local Health Departments also can provide primary care services.  [facility]  [place] [address] [phone]  [facility] [place] [address] [place] Department [place] [phone]

## 2019-06-11 NOTE — ED Provider Notes (Signed)
TIME SEEN: 11:12 PM  CHIEF COMPLAINT: Bilateral testicular pain  HPI: Patient is a 23 year old male who presents the emergency department with 3 to 4 days of bilateral testicular discomfort.  No trauma to the area.  No dysuria, hematuria, penile discharge, testicular masses, scrotal swelling, redness or bruising.  Sexually active with one male partner.  Does not use protection.  Declines STD screening today.  States he is not concerned for STDs.  He denies abdominal pain, flank pain.  No history of kidney stones.  No nausea or vomiting.  No fever.  ROS: See HPI Constitutional: no fever  Eyes: no drainage  ENT: no runny nose   Cardiovascular:  no chest pain  Resp: no SOB  GI: no vomiting GU: no dysuria Integumentary: no rash  Allergy: no hives  Musculoskeletal: no leg swelling  Neurological: no slurred speech ROS otherwise negative  PAST MEDICAL HISTORY/PAST SURGICAL HISTORY:  History reviewed. No pertinent past medical history.  MEDICATIONS:  Prior to Admission medications   Medication Sig Start Date End Date Taking? Authorizing Provider  azithromycin (ZITHROMAX) 250 MG tablet Take 1 tablet (250 mg total) by mouth daily. Take first 2 tablets together, then 1 every day until finished. Patient not taking: Reported on 05/01/2015 06/15/14   Delos Haring, PA-C  benzonatate (TESSALON) 100 MG capsule Take 1 capsule (100 mg total) by mouth every 8 (eight) hours. 06/25/16   Hedges, Dellis Filbert, PA-C  clindamycin (CLEOCIN) 300 MG capsule Take 1 capsule (300 mg total) by mouth 4 (four) times daily. 05/01/15   Rolland Porter, MD  guaiFENesin-codeine 100-10 MG/5ML syrup Take 5-10 mLs by mouth 3 (three) times daily as needed for cough. Patient not taking: Reported on 05/01/2015 06/15/14   Delos Haring, PA-C  ibuprofen (ADVIL,MOTRIN) 800 MG tablet Take 1 tablet (800 mg total) by mouth 3 (three) times daily. 01/28/17   Hedges, Dellis Filbert, PA-C  phenol (CHLORASEPTIC) 1.4 % LIQD Use as directed 1 spray in  the mouth or throat as needed for throat irritation / pain. Patient not taking: Reported on 05/01/2015 10/25/14   Larene Pickett, PA-C  Witch Hazel 14 % LIQD Apply 1 application topically once.    [provider]    ALLERGIES:  No Known Allergies  SOCIAL HISTORY:  Social History   Tobacco Use  . Smoking status: Never Smoker  . Smokeless tobacco: Never Used  Substance Use Topics  . Alcohol use: No    FAMILY HISTORY: Family History  Problem Relation Age of Onset  . Hypertension Other   . Diabetes Other   . CAD Other     EXAM: BP (!) 141/84 (BP Location: Right Arm)   Pulse (!) 56   Temp 98.4 F (36.9 C)   Resp 18   Ht 5\' 10"  (1.778 m)   Wt 117.9 kg   SpO2 100%   BMI 37.31 kg/m  CONSTITUTIONAL: Alert and oriented and responds appropriately to questions. Well-appearing; well-nourished HEAD: Normocephalic EYES: Conjunctivae clear, pupils appear equal, EOM appear intact ENT: normal nose; moist mucous membranes NECK: Supple, normal ROM CARD: RRR; S1 and S2 appreciated; no murmurs, no clicks, no rubs, no gallops RESP: Normal chest excursion without splinting or tachypnea; breath sounds clear and equal bilaterally; no wheezes, no rhonchi, no rales, no hypoxia or respiratory distress, speaking full sentences ABD/GI: Normal bowel sounds; non-distended; soft, non-tender, no rebound, no guarding, no peritoneal signs, no hepatosplenomegaly GU:  Normal external genitalia, circumcised male, normal penile shaft, no blood or discharge at the urethral meatus,  no testicular masses or tenderness on exam, no scrotal masses or swelling, no hernias appreciated, 2+ femoral pulses bilaterally; no perineal erythema, warmth, subcutaneous air or crepitus; no high riding testicle, normal bilateral cremasteric reflex.  Chaperone present for exam. BACK:  The back appears normal EXT: Normal ROM in all joints; no deformity noted, no edema; no cyanosis SKIN: Normal color for age and race; warm;  no rash on exposed skin NEURO: Moves all extremities equally PSYCH: The patient's mood and manner are appropriate.   MEDICAL DECISION MAKING: Patient here with testicular pain.  Scrotal ultrasound with Doppler shows no acute abnormality other than small left varicocele which I doubt is causing his symptoms.  No torsion or epididymitis.  No masses on exam.  No sign of scrotal cellulitis, Fournier's gangrene.  Urine does not appear infected.  He declines STD screening today.  Recommend alternating Tylenol and Motrin and follow-up with PCP if symptoms continue.  No flank pain, abdominal pain, fevers, vomiting.  Doubt kidney stone.  He is extremely well-appearing here.  Declines pain medication.  At this time, I do not feel there is any life-threatening condition present. I have reviewed, interpreted and discussed all results (EKG, imaging, lab, urine as appropriate) and exam findings with patient/family. I have reviewed nursing notes and appropriate previous records.  I feel the patient is safe to be discharged home without further emergent workup and can continue workup as an outpatient as needed. Discussed usual and customary return precautions. Patient/family verbalize understanding and are comfortable with this plan.  Outpatient follow-up has been provided as needed. All questions have been answered.   Philip Baird was evaluated in Emergency Department on 06/11/2019 for the symptoms described in the history of present illness. He was evaluated in the context of the global COVID-19 pandemic, which necessitated consideration that the patient might be at risk for infection with the SARS-CoV-2 virus that causes COVID-19. Institutional protocols and algorithms that pertain to the evaluation of patients at risk for COVID-19 are in a state of rapid change based on information released by regulatory bodies including the CDC and federal and state organizations. These policies and algorithms were followed during  the patient's care in the ED.  Patient was seen wearing N95, face shield, gloves.    Philip Baird, Philip Maw, DO 06/12/19 [phone]

## 2019-06-13 LAB — URINE CULTURE: Culture: <10000 — AB

## 2019-09-05 ENCOUNTER — Emergency Department (HOSPITAL_BASED_OUTPATIENT_CLINIC_OR_DEPARTMENT_OTHER)
Admission: EM | Admit: 2019-09-05 | Discharge: 2019-09-06 | Disposition: A | Discharge status: Home / Self Care | Attending: Emergency Medicine | Admitting: Emergency Medicine

## 2019-09-05 ENCOUNTER — Other Ambulatory Visit: Payer: Self-pay

## 2019-09-05 ENCOUNTER — Encounter (HOSPITAL_BASED_OUTPATIENT_CLINIC_OR_DEPARTMENT_OTHER): Payer: Self-pay | Admitting: Emergency Medicine

## 2019-09-05 DIAGNOSIS — Y999 Unspecified external cause status: Secondary | ICD-10-CM | POA: Insufficient documentation

## 2019-09-05 DIAGNOSIS — Y93B2 Activity, push-ups, pull-ups, sit-ups: Secondary | ICD-10-CM | POA: Diagnosis not present

## 2019-09-05 DIAGNOSIS — Y92008 Other place in unspecified non-institutional (private) residence as the place of occurrence of the external cause: Secondary | ICD-10-CM | POA: Insufficient documentation

## 2019-09-05 DIAGNOSIS — R519 Headache, unspecified: Secondary | ICD-10-CM | POA: Diagnosis not present

## 2019-09-05 DIAGNOSIS — Z79899 Other long term (current) drug therapy: Secondary | ICD-10-CM | POA: Insufficient documentation

## 2019-09-05 DIAGNOSIS — W1789XA Other fall from one level to another, initial encounter: Secondary | ICD-10-CM | POA: Insufficient documentation

## 2019-09-05 DIAGNOSIS — S0990XA Unspecified injury of head, initial encounter: Secondary | ICD-10-CM | POA: Insufficient documentation

## 2019-09-05 MED ORDER — HYDROCODONE-ACETAMINOPHEN 5-325 MG PO TABS
1.0000 | ORAL_TABLET | Freq: Once | ORAL | Status: AC
  Administered 2019-09-05: 1 via ORAL
  Filled 2019-09-05: qty 1

## 2019-09-05 MED ORDER — KETOROLAC TROMETHAMINE 30 MG/ML IJ SOLN
30.0000 mg | Freq: Once | INTRAMUSCULAR | Status: AC
  Administered 2019-09-05: 30 mg via INTRAMUSCULAR
  Filled 2019-09-05: qty 1

## 2019-09-05 NOTE — ED Provider Notes (Signed)
[facility] DEPART[facility] Provider Note   CSN: [id number] Arrival date & time: 09/05/19  2315     History Chief Complaint  Patient presents with  . Head Injury    Philip Baird is a 24 y.o. male.  HPI     This is a 24 year old male with no significant past medical history who presents with headache.  Patient reports that he was pulling up on a pull bar that was mounted above his door.  The pull-up bar fell off of the door causing him to fall backwards onto the floor.  He reports he fell onto hardwood floor and hit his head.  He is unsure whether he lost consciousness but states if he did it was very brief.  This happened approximately 1 hour prior to arrival.  He reports an 8 out of 10 headache.  He took an ibuprofen at home with no relief.  Denies any neck or back pain.  Denies any nausea or vomiting.  Denies any vision changes, weakness, numbness, strokelike symptoms.  History reviewed. No pertinent past medical history.  There are no problems to display for this patient.   Past Surgical History:  Procedure Laterality Date  . HERNIA REPAIR         Family History  Problem Relation Age of Onset  . Hypertension Other   . Diabetes Other   . CAD Other     Social History   Tobacco Use  . Smoking status: Never Smoker  . Smokeless tobacco: Never Used  Substance Use Topics  . Alcohol use: No  . Drug use: No    Home Medications Prior to Admission medications   Medication Sig Start Date End Date Taking? Authorizing Provider  azithromycin (ZITHROMAX) 250 MG tablet Take 1 tablet (250 mg total) by mouth daily. Take first 2 tablets together, then 1 every day until finished. Patient not taking: Reported on 05/01/2015 06/15/14   Delos Haring, PA-C  benzonatate (TESSALON) 100 MG capsule Take 1 capsule (100 mg total) by mouth every 8 (eight) hours. 06/25/16   Hedges, Dellis Filbert, PA-C  clindamycin (CLEOCIN) 300 MG capsule Take 1 capsule (300 mg total) by mouth  4 (four) times daily. 05/01/15   Rolland Porter, MD  guaiFENesin-codeine 100-10 MG/5ML syrup Take 5-10 mLs by mouth 3 (three) times daily as needed for cough. Patient not taking: Reported on 05/01/2015 06/15/14   Delos Haring, PA-C  ibuprofen (ADVIL,MOTRIN) 800 MG tablet Take 1 tablet (800 mg total) by mouth 3 (three) times daily. 01/28/17   Hedges, Dellis Filbert, PA-C  naproxen (NAPROSYN) 500 MG tablet Take 1 tablet (500 mg total) by mouth 2 (two) times daily. 09/06/19   Gar Glance, Barbette Hair, MD  phenol (CHLORASEPTIC) 1.4 % LIQD Use as directed 1 spray in the mouth or throat as needed for throat irritation / pain. Patient not taking: Reported on 05/01/2015 10/25/14   Larene Pickett, PA-C  Witch Hazel 14 % LIQD Apply 1 application topically once.    [provider]    Allergies    Patient has no known allergies.  Review of Systems   Review of Systems  Constitutional: Negative for fever.  Respiratory: Negative for shortness of breath.   Cardiovascular: Negative for chest pain.  Gastrointestinal: Negative for nausea and vomiting.  Musculoskeletal: Negative for back pain and neck pain.  Neurological: Positive for headaches. Negative for dizziness, weakness, light-headedness and numbness.  All other systems reviewed and are negative.   Physical Exam Updated Vital Signs BP 131/78 (BP  Location: Left Arm)   Pulse (!) 57   Temp 98.2 F (36.8 C) (Oral)   Resp 18   Ht 1.778 m (5\' 10" )   Wt 117 kg   SpO2 99%   BMI 37.01 kg/m   Physical Exam Vitals and nursing note reviewed.  Constitutional:      Appearance: He is well-developed.     Comments: Overweight, ABCs intact  HENT:     Head: Normocephalic and atraumatic.     Comments: Tenderness to palpation posterior head without hematoma or obvious swelling    Right Ear: Tympanic membrane normal.     Ears:     Comments: No evidence of hemotympanum, cerumen impaction on the left    Mouth/Throat:     Mouth: Mucous membranes are moist.   Eyes:     Extraocular Movements: Extraocular movements intact.     Pupils: Pupils are equal, round, and reactive to light.  Neck:     Comments: No midline C-spine tenderness to palpation, step-off, deformity Cardiovascular:     Rate and Rhythm: Normal rate and regular rhythm.     Heart sounds: Normal heart sounds. No murmur.  Pulmonary:     Effort: Pulmonary effort is normal. No respiratory distress.     Breath sounds: Normal breath sounds. No wheezing.  Abdominal:     General: Bowel sounds are normal.     Palpations: Abdomen is soft.     Tenderness: There is no abdominal tenderness. There is no rebound.  Musculoskeletal:     Cervical back: Neck supple.  Lymphadenopathy:     Cervical: No cervical adenopathy.  Skin:    General: Skin is warm and dry.  Neurological:     Mental Status: He is alert and oriented to person, place, and time.     Comments: 5 out of 5 strength in all 4 extremities, normal gait  Psychiatric:        Mood and Affect: Mood normal.     ED Results / Procedures / Treatments   Labs (all labs ordered are listed, but only abnormal results are displayed) Labs Reviewed - No data to display  EKG None  Radiology No results found.  Procedures Procedures (including critical care time)  Medications Ordered in ED Medications  ketorolac (TORADOL) 30 MG/ML injection 30 mg (30 mg Intramuscular Given 09/05/19 2348)  HYDROcodone-acetaminophen (NORCO/VICODIN) 5-325 MG per tablet 1 tablet (1 tablet Oral Given 09/05/19 2346)    ED Course  I have reviewed the triage vital signs and the nursing notes.  Pertinent labs & imaging results that were available during my care of the patient were reviewed by me and considered in my medical decision making (see chart for details).    MDM Rules/Calculators/A&P                       Patient presents after fall hitting his head.  He is overall nontoxic-appearing and vital signs are reassuring.  He has no significant  physical exam findings.  Per 09/07/19 CT head rules, he is low risk for intracranial bleed.  I discussed with him that he likely has a minor head injury.  We also discussed symptoms that would suggest concussion.  We will treat supportively.  Recommend anti-inflammatories.  If he develops ongoing headache, nausea, fogginess, he should decrease stimulus.  Patient given a work note to be excused from his [employer] exercises for the next 24 hours.  After history, exam, and medical workup I feel the patient  has been appropriately medically screened and is safe for discharge home. Pertinent diagnoses were discussed with the patient. Patient was given return precautions.   Final Clinical Impression(s) / ED Diagnoses Final diagnoses:  Minor head injury, initial encounter    Rx / DC Orders ED Discharge Orders         Ordered    naproxen (NAPROSYN) 500 MG tablet  2 times daily     09/06/19 0018           Seve Monette, Mayer Masker, MD 09/06/19 0021

## 2019-09-05 NOTE — ED Triage Notes (Signed)
Patient presents with complaints of head injury; states while exercising the pullup bar fell and struck him in the head; denies LOC. States took ibuprofen pta.

## 2019-09-06 MED ORDER — NAPROXEN 500 MG PO TABS: 500.0000 mg | ORAL_TABLET | Freq: Two times a day (BID) | ORAL | 0 refills | Status: DC

## 2019-09-06 NOTE — Discharge Instructions (Signed)
You were seen today after mild head injury.  Your evaluation is reassuring.  Take naproxen as needed for pain.  Apply ice.  If you develop recurrent headaches, nausea, foggy feeling, you may have a mild concussion.  You should decrease stimulus.  If you develop vomiting, worsening headache, any neurologic symptoms you should be reevaluated immediately.

## 2020-06-08 ENCOUNTER — Other Ambulatory Visit: Payer: Self-pay

## 2020-06-08 ENCOUNTER — Encounter (HOSPITAL_BASED_OUTPATIENT_CLINIC_OR_DEPARTMENT_OTHER): Payer: Self-pay | Admitting: Emergency Medicine

## 2020-06-08 ENCOUNTER — Emergency Department (HOSPITAL_BASED_OUTPATIENT_CLINIC_OR_DEPARTMENT_OTHER)
Admission: EM | Admit: 2020-06-08 | Discharge: 2020-06-09 | Disposition: A | Discharge status: Home / Self Care | Attending: Emergency Medicine | Admitting: Emergency Medicine

## 2020-06-08 DIAGNOSIS — S71112A Laceration without foreign body, left thigh, initial encounter: Secondary | ICD-10-CM | POA: Insufficient documentation

## 2020-06-08 DIAGNOSIS — Z23 Encounter for immunization: Secondary | ICD-10-CM | POA: Diagnosis not present

## 2020-06-08 DIAGNOSIS — W268XXA Contact with other sharp object(s), not elsewhere classified, initial encounter: Secondary | ICD-10-CM | POA: Diagnosis not present

## 2020-06-08 DIAGNOSIS — S79922A Unspecified injury of left thigh, initial encounter: Secondary | ICD-10-CM | POA: Diagnosis present

## 2020-06-08 NOTE — ED Triage Notes (Signed)
Cut inner left thigh on wire 1 hour ago. Bleeding controlled.

## 2020-06-09 MED ORDER — LIDOCAINE-EPINEPHRINE 2 %-1:100000 IJ SOLN: 20.0000 mL | Freq: Once | INTRAMUSCULAR | Status: DC

## 2020-06-09 MED ORDER — TETANUS-DIPHTH-ACELL PERTUSSIS 5-2.5-18.5 LF-MCG/0.5 IM SUSY
0.5000 mL | PREFILLED_SYRINGE | Freq: Once | INTRAMUSCULAR | Status: AC
  Administered 2020-06-09: 01:00:00 0.5 mL via INTRAMUSCULAR
  Filled 2020-06-09: qty 0.5

## 2020-06-09 MED ORDER — LIDOCAINE-EPINEPHRINE 1 %-1:100000 IJ SOLN
INTRAMUSCULAR | Status: AC
  Administered 2020-06-09: 01:00:00 1 mL
  Filled 2020-06-09: qty 1

## 2020-06-09 NOTE — ED Provider Notes (Signed)
[facility] Provider Note: Lowella Dell, MD, FACEP  CSN: [id number] MRN: [id number] ARRIVAL: 06/08/20 at 2347 ROOM: [facility]   CHIEF COMPLAINT  Laceration   HISTORY OF PRESENT ILLNESS  06/09/20 12:36 AM Philip Baird is a 24 y.o. male who cut his left medial thigh on a piece of wire about 1 hour prior to arrival.  The wire was being used as a Passenger transport manager on a cooler.  He rates associated pain is a 3 out of 10, worse with movement or palpation.  Bleeding has been controlled.  He is not sure of tetanus status.   History reviewed. No pertinent past medical history.  Past Surgical History:  Procedure Laterality Date  . HERNIA REPAIR      Family History  Problem Relation Age of Onset  . Hypertension Other   . Diabetes Other   . CAD Other     Social History   Tobacco Use  . Smoking status: Never Smoker  . Smokeless tobacco: Never Used  Substance Use Topics  . Alcohol use: No  . Drug use: No    Prior to Admission medications   Not on File    Allergies Patient has no known allergies.   REVIEW OF SYSTEMS  Negative except as noted here or in the History of Present Illness.   PHYSICAL EXAMINATION  Initial Vital Signs Blood pressure 121/72, pulse 88, temperature 98.6 F (37 C), temperature source Oral, resp. rate 18, height 5\' 10"  (1.778 m), weight 117 kg, SpO2 100 %.  Examination General: Well-developed, well-nourished male in no acute distress; appearance consistent with age of record HENT: normocephalic; atraumatic Eyes: Normal appearance Neck: supple Heart: regular rate and rhythm Lungs: clear to auscultation bilaterally Abdomen: soft; nondistended; nontender; bowel sounds present Extremities: No deformity; full range of motion; pulses normal Neurologic: Awake, alert and oriented; motor function intact in all extremities and symmetric; no facial droop Skin: Warm and dry; partial thickness laceration of left anteromedial  thigh:    Psychiatric: Normal mood and affect   RESULTS  Summary of this visit's results, reviewed and interpreted by myself:   EKG Interpretation  Date/Time:    Ventricular Rate:    PR Interval:    QRS Duration:   QT Interval:    QTC Calculation:   R Axis:     Text Interpretation:        Laboratory Studies: No results found for this or any previous visit (from the past 24 hour(s)). Imaging Studies: No results found.  ED COURSE and MDM  Nursing notes, initial and subsequent vitals signs, including pulse oximetry, reviewed and interpreted by myself.  Vitals:   06/08/20 2356 06/09/20 0024  BP: 121/72   Pulse: 88   Resp: 18   Temp: 98.6 F (37 C)   TempSrc: Oral   SpO2: 100%   Weight:  117 kg  Height:  5\' 10"  (1.778 m)   Medications  lidocaine-EPINEPHrine (XYLOCAINE W/EPI) 2 %-1:100000 (with pres) injection 20 mL (has no administration in time range)  Tdap (BOOSTRIX) injection 0.5 mL (0.5 mLs Intramuscular Given 06/09/20 0046)  lidocaine-EPINEPHrine (XYLOCAINE W/EPI) 1 %-1:100000 (with pres) injection (1 mL  Given 06/09/20 0044)      PROCEDURES  Procedures  LACERATION REPAIR Performed by: 06/11/20 Sedona Wenk Authorized by: 06/11/20 Graeme Menees Consent: Verbal consent obtained. Risks and benefits: risks, benefits and alternatives were discussed Consent given by: patient Patient identity confirmed: provided demographic data Prepped and Draped in normal sterile fashion Wound explored  Laceration  Location: Left thigh  Laceration Length: 5 cm  No Foreign Bodies seen or palpated  Anesthesia: local infiltration  Local anesthetic: lidocaine 2% with epinephrine  Anesthetic total: 5 ml  Irrigation method: syringe Amount of cleaning: standard  Skin closure: 4-0 Prolene  Number of sutures: 8  Technique: Simple interrupted  Patient tolerance: Patient tolerated the procedure well with no immediate complications.    ED DIAGNOSES     ICD-10-CM   1.  Laceration of left thigh, initial encounter  B93.903E        Paula Libra, MD 06/09/20 [phone]

## 2020-06-09 NOTE — ED Notes (Signed)
Wound care completed. Departs ED at this time in stable condition. Discharge instructions reviewed.

## 2020-06-24 ENCOUNTER — Emergency Department (HOSPITAL_BASED_OUTPATIENT_CLINIC_OR_DEPARTMENT_OTHER)
Admission: EM | Admit: 2020-06-24 | Discharge: 2020-06-24 | Disposition: A | Discharge status: Home / Self Care | Attending: Emergency Medicine | Admitting: Emergency Medicine

## 2020-06-24 ENCOUNTER — Other Ambulatory Visit: Payer: Self-pay

## 2020-06-24 ENCOUNTER — Encounter (HOSPITAL_BASED_OUTPATIENT_CLINIC_OR_DEPARTMENT_OTHER): Payer: Self-pay

## 2020-06-24 DIAGNOSIS — Z4802 Encounter for removal of sutures: Secondary | ICD-10-CM | POA: Diagnosis not present

## 2020-06-24 NOTE — ED Notes (Signed)
Sutures removed as ordered to left inner thigh, by Asher Muir RN, tol well

## 2020-06-24 NOTE — ED Triage Notes (Signed)
Pt arrives ambulatory to get stitches removed from left inner thigh. No redness swelling or fevers, no other complaints.

## 2020-06-24 NOTE — ED Provider Notes (Signed)
[facility] EMERGENCY DEPARTMENT Provider Note   CSN: [id number] Arrival date & time: 06/24/20  1025     History Chief Complaint  Patient presents with   Suture / Staple Removal    Philip Baird is a 25 y.o. male.  Patient presents for suture removal.  Patient sustained a laceration on his left medial thigh on 06/08/2020.  He had 8 Prolene sutures placed at that time.  He reports that the areas been healing well without significant pain, redness, or drainage.        History reviewed. No pertinent past medical history.  There are no problems to display for this patient.   Past Surgical History:  Procedure Laterality Date   HERNIA REPAIR         Family History  Problem Relation Age of Onset   Hypertension Other    Diabetes Other    CAD Other     Social History   Tobacco Use   Smoking status: Never Smoker   Smokeless tobacco: Never Used  Substance Use Topics   Alcohol use: No   Drug use: No    Home Medications Prior to Admission medications   Not on File    Allergies    Patient has no known allergies.  Review of Systems   Review of Systems  Constitutional: Negative for fever.  Musculoskeletal: Negative for myalgias.  Skin: Positive for wound. Negative for color change.    Physical Exam Updated Vital Signs BP 120/83 (BP Location: Left Arm)    Pulse 68    Temp 98.9 F (37.2 C) (Oral)    Resp 16    Ht 5\' 10"  (1.778 m)    Wt 117.9 kg    SpO2 99%    BMI 37.31 kg/m   Physical Exam Vitals and nursing note reviewed.  Constitutional:      Appearance: He is well-developed and well-nourished.  HENT:     Head: Normocephalic and atraumatic.  Eyes:     Conjunctiva/sclera: Conjunctivae normal.  Pulmonary:     Effort: No respiratory distress.  Musculoskeletal:     Cervical back: Normal range of motion and neck supple.  Skin:    General: Skin is warm and dry.     Comments: Patient with linear laceration closed with a Prolene  sutures.  Appears to be well-healing.  No active drainage.  Neurological:     Mental Status: He is alert.  Psychiatric:        Mood and Affect: Mood and affect normal.     ED Results / Procedures / Treatments   Labs (all labs ordered are listed, but only abnormal results are displayed) Labs Reviewed - No data to display  EKG None  Radiology No results found.  Procedures .Suture Removal  Date/Time: 06/24/2020 10:56 AM Performed by: 08/22/2020, PA-C Authorized by: Renne Crigler, PA-C   Consent:    Consent obtained:  Verbal   Consent given by:  Patient   Risks discussed:  Pain Universal protocol:    Patient identity confirmed:  Verbally with patient Location:    Location:  Lower extremity   Lower extremity location:  Leg   Leg location:  L upper leg Procedure details:    Wound appearance:  No signs of infection, good wound healing and clean   Number of sutures removed:  8 Post-procedure details:    Post-removal:  No dressing applied   Procedure completion:  Tolerated   (including critical care time)  Medications Ordered in ED Medications -  No data to display  ED Course  I have reviewed the triage vital signs and the nursing notes.  Pertinent labs & imaging results that were available during my care of the patient were reviewed by me and considered in my medical decision making (see chart for details).  Patient seen and examined.  Sutures removed without complication.  Patient counseled on care of healing wound.  Vital signs reviewed and are as follows: BP 120/83 (BP Location: Left Arm)    Pulse 68    Temp 98.9 F (37.2 C) (Oral)    Resp 16    Ht 5\' 10"  (1.778 m)    Wt 117.9 kg    SpO2 99%    BMI 37.31 kg/m       MDM Rules/Calculators/A&P                          Suture removal without complication, wound is well-healing.  Final Clinical Impression(s) / ED Diagnoses Final diagnoses:  Visit for suture removal    Rx / DC Orders ED Discharge  Orders    None       , PA-C 06/24/20 1057    08/22/20, MD 06/26/20 1540

## 2020-06-24 NOTE — ED Notes (Signed)
ED Provider at bedside. 

## 2021-02-16 IMAGING — US US SCROTUM W/ DOPPLER COMPLETE
1 series · 14 of 25 positions shown · non-contrast
Comparison: None

CLINICAL DATA: BILATERAL testicular pain for 4 days RIGHT greater
than LEFT

EXAM:
SCROTAL ULTRASOUND
DOPPLER ULTRASOUND OF THE TESTICLES
TECHNIQUE: Complete ultrasound examination of the testicles, epididymis, and
other scrotal structures was performed. Color and spectral Doppler
ultrasound were also utilized to evaluate blood flow to the
testicles.

[Series 1: us scrotum w/ doppler complete · 14 of 45 slices shown]
[im 1/45]
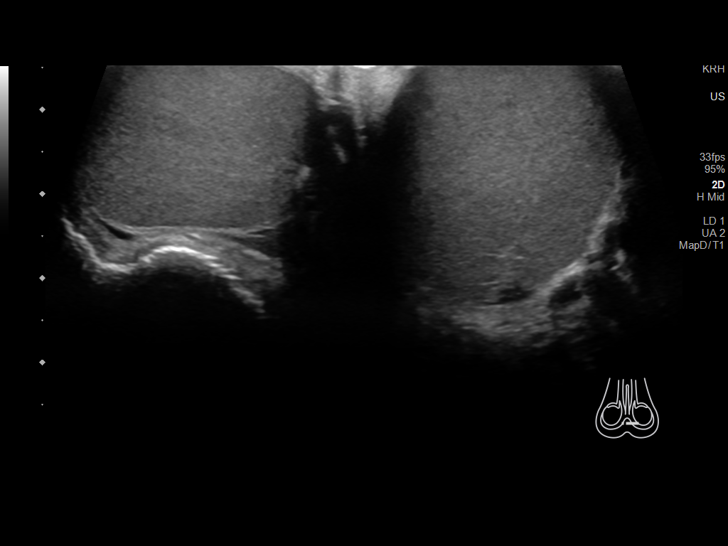
[im 4/45]
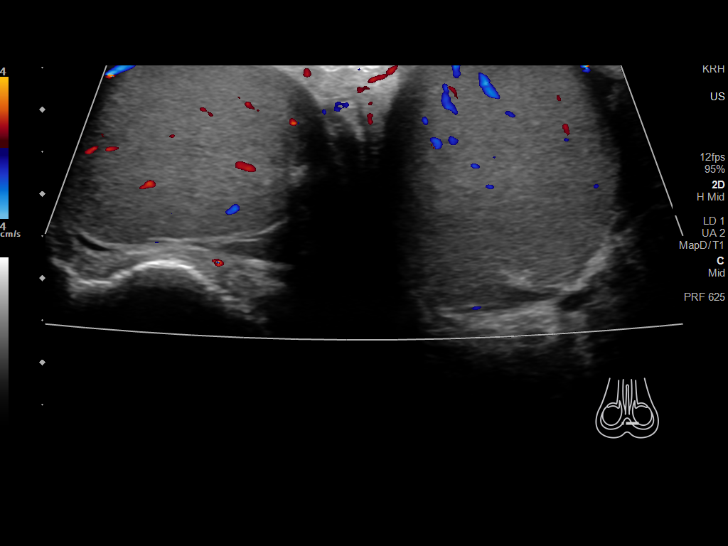
[im 8/45]
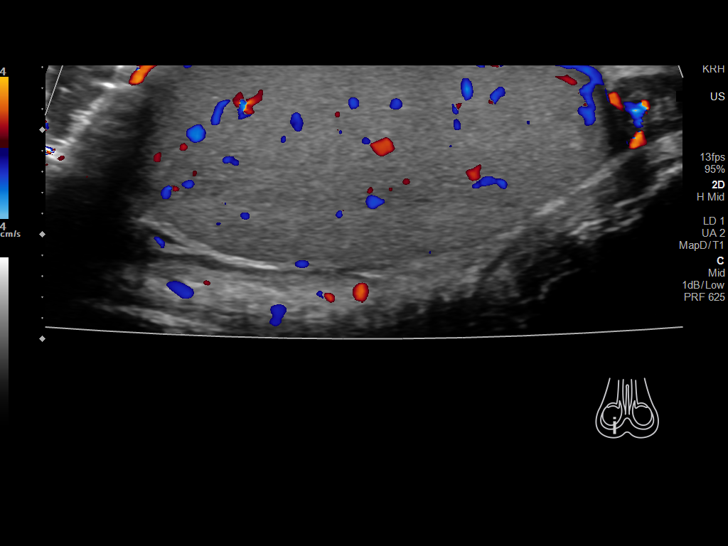
[im 12/45]
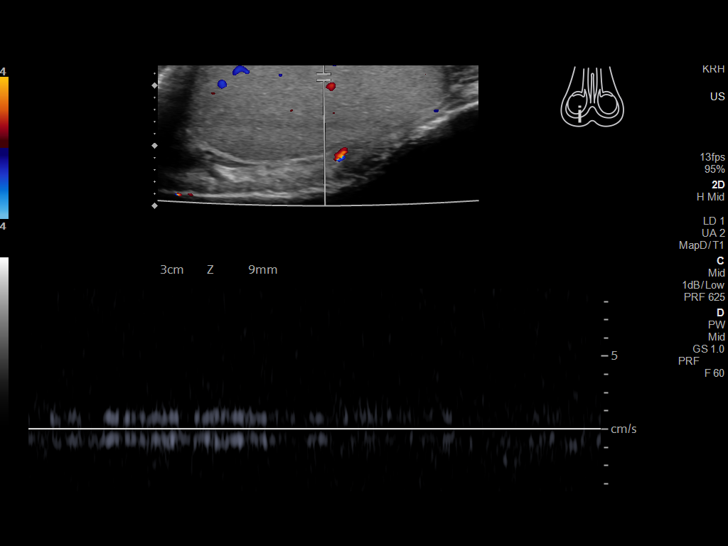
[im 15/45]
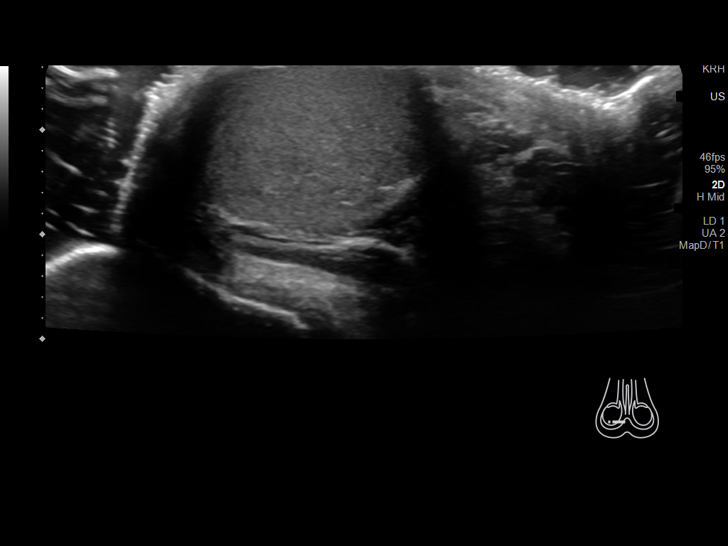
[im 17/45]
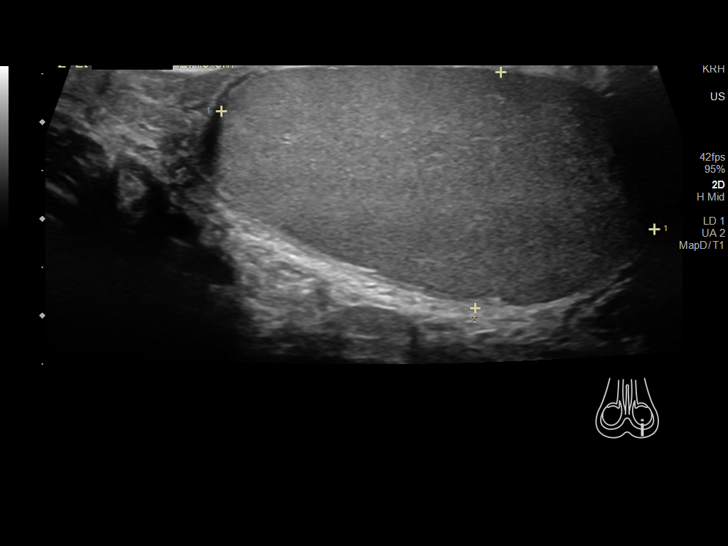
[im 21/45]
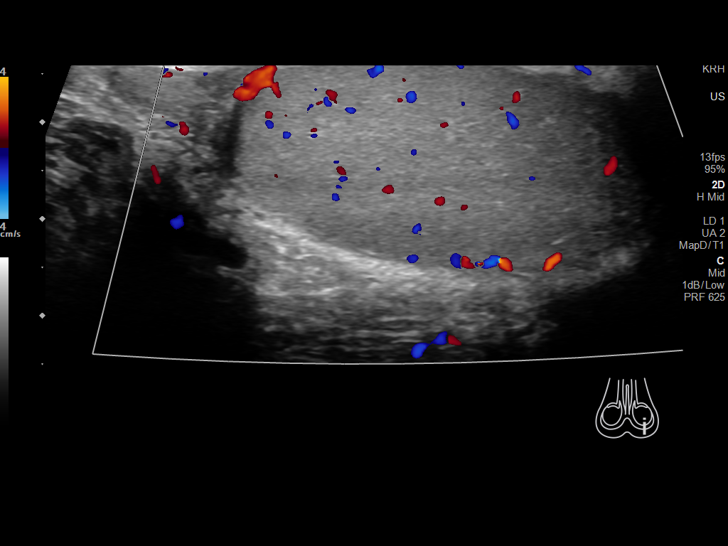
[im 24/45]
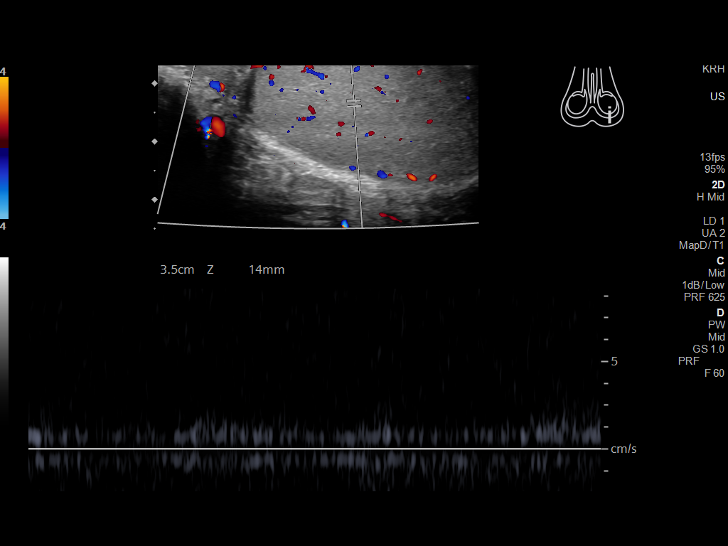
[im 28/45]
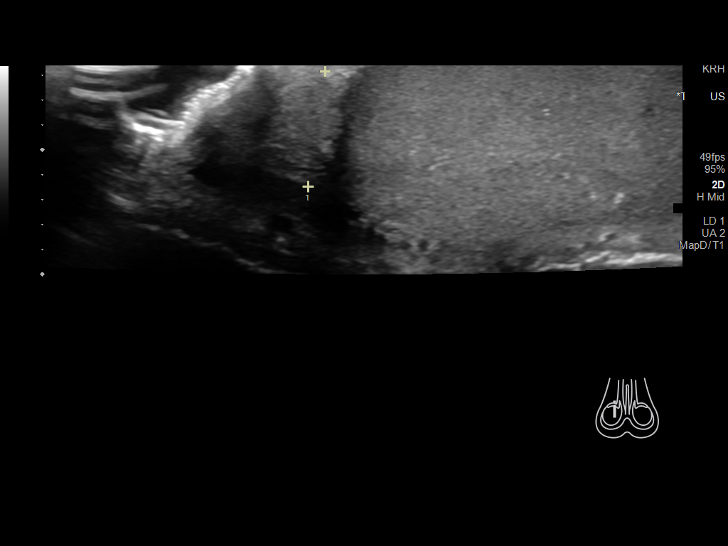
[im 30/45]
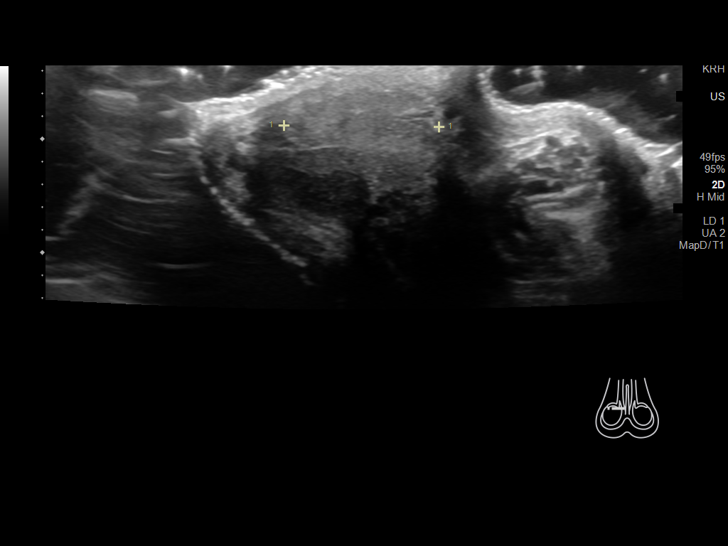
[im 34/45]
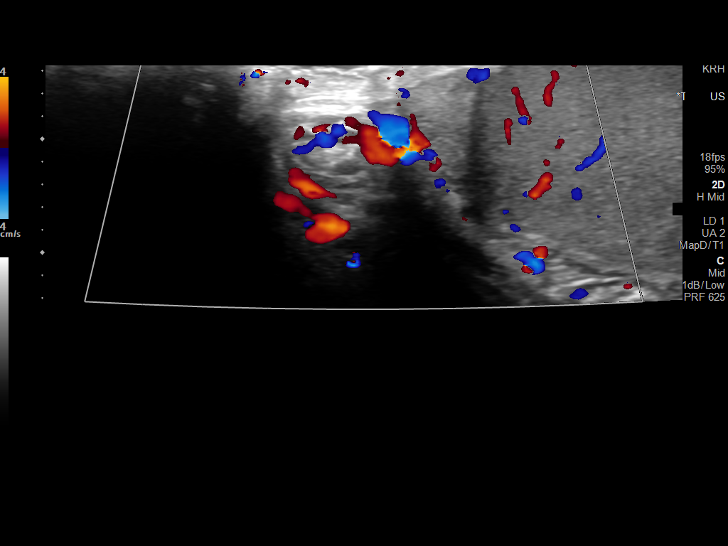
[im 37/45]
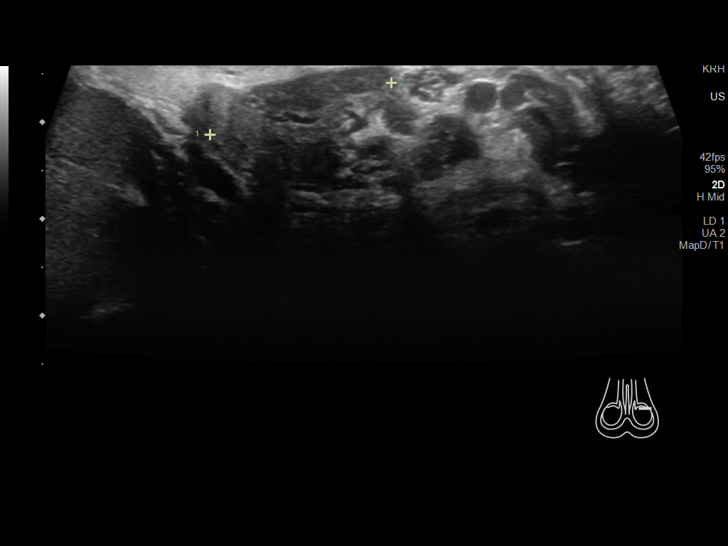
[im 41/45]
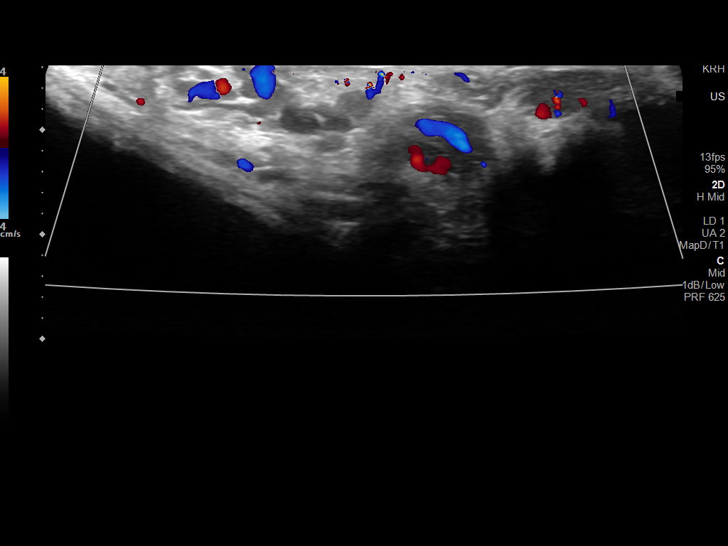
[im 45/45]
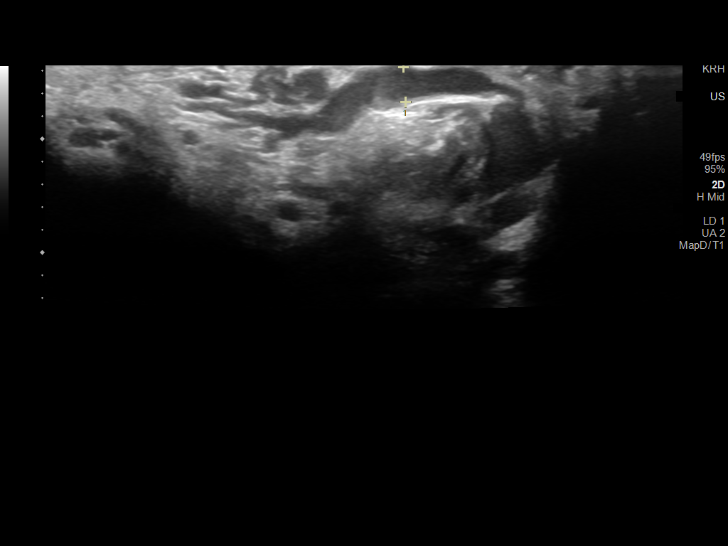

[14 of 25 positions shown; findings below may reference images not displayed]

FINDINGS: Right testicle

Measurements: 4.6 x 2.1 x 3.0 cm. Normal echogenicity without mass.
Few tiny microcalcifications, nonspecific. Internal blood flow
present on color Doppler imaging.

Left testicle

Measurements: 4.6 x 2.5 x 3.0 cm. Normal echogenicity without mass
or calcification. Internal blood flow present on color Doppler
imaging, symmetric with RIGHT

Right epididymis:  Normal in size and appearance.

Left epididymis:  Normal in size and appearance.

Hydrocele:  None visualized.

Varicocele:  Small LEFT varicocele

Pulsed Doppler interrogation of both testes demonstrates normal low
resistance arterial and venous waveforms in both testes.
IMPRESSION: Normal appearing testes and epididymi bilaterally.

Small LEFT varicocele.

## 2022-05-26 ENCOUNTER — Ambulatory Visit
Admission: EM | Admit: 2022-05-26 | Discharge: 2022-05-26 | Disposition: A | Discharge status: Home / Self Care | Payer: Medicaid Other | Attending: Emergency Medicine | Admitting: Emergency Medicine

## 2022-05-26 DIAGNOSIS — J302 Other seasonal allergic rhinitis: Secondary | ICD-10-CM

## 2022-05-26 DIAGNOSIS — H6120 Impacted cerumen, unspecified ear: Secondary | ICD-10-CM

## 2022-05-26 DIAGNOSIS — H66002 Acute suppurative otitis media without spontaneous rupture of ear drum, left ear: Secondary | ICD-10-CM | POA: Diagnosis not present

## 2022-05-26 MED ORDER — CETIRIZINE HCL 10 MG PO TABS: 10.0000 mg | ORAL_TABLET | Freq: Every day | ORAL | 1 refills | Status: AC

## 2022-05-26 MED ORDER — FLUTICASONE PROPIONATE 50 MCG/ACT NA SUSP: 1.0000 | Freq: Every day | NASAL | 2 refills | Status: AC

## 2022-05-26 MED ORDER — CEFDINIR 300 MG PO CAPS: 300.0000 mg | ORAL_CAPSULE | Freq: Two times a day (BID) | ORAL | 0 refills | Status: AC

## 2022-05-26 NOTE — ED Provider Notes (Signed)
[facility]    CSN: [id number] Arrival date & time: 05/26/22  1436    HISTORY   Chief Complaint  Patient presents with   Ear Fullness   HPI Philip Baird is a pleasant, 26 y.o. male who presents to urgent care today. Patient complains of pain in his left ear and feeling that his left ear is clogged since attempting to perform sinus rinse this morning.  Patient states this is the second time he is attempted to perform it, the first time he has performed it without assistance from his wife, states he began to feel pressure in his left ear and became very dizzy so he stopped sinus rinse before finishing.  Patient reports a history of seasonal allergies, states he usually takes Zyrtec but is not taking it at this time.  Patient denies fever, body aches, chills, nausea, vomiting, diarrhea, known sick contacts, sore throat, headache.  The history is provided by the patient.   History reviewed. No pertinent past medical history. There are no problems to display for this patient.  Past Surgical History:  Procedure Laterality Date   HERNIA REPAIR      Home Medications    Prior to Admission medications   Not on File    Family History Family History  Problem Relation Age of Onset   Hypertension Other    Diabetes Other    CAD Other    Social History Social History   Tobacco Use   Smoking status: Some Days    Types: Cigars   Smokeless tobacco: Never  Substance Use Topics   Alcohol use: Yes    Comment: Occ   Drug use: No   Allergies   Patient has no known allergies.  Review of Systems Review of Systems Pertinent findings revealed after performing a 14 point review of systems has been noted in the history of present illness.  Physical Exam Triage Vital Signs ED Triage Vitals  Enc Vitals Group     BP 04/12/21 0827 (!) 147/82     Pulse Rate 04/12/21 0827 72     Resp 04/12/21 0827 18     Temp 04/12/21 0827 98.3 F (36.8 C)     Temp Source 04/12/21 0827  Oral     SpO2 04/12/21 0827 98 %     Weight --      Height --      Head Circumference --      Peak Flow --      Pain Score 04/12/21 0826 5     Pain Loc --      Pain Edu? --      Excl. in GC? --   No data found.  Updated Vital Signs BP 118/68 (BP Location: Right Arm)   Pulse (!) 56   Temp 98 F (36.7 C) (Oral)   Resp 16   SpO2 96%   Physical Exam Vitals and nursing note reviewed.  Constitutional:      General: He is not in acute distress.    Appearance: Normal appearance. He is not ill-appearing.  HENT:     Head: Normocephalic and atraumatic.     Salivary Glands: Right salivary gland is not diffusely enlarged or tender. Left salivary gland is not diffusely enlarged or tender.     Right Ear: Ear canal and external ear normal. No drainage. No middle ear effusion. There is no impacted cerumen. Tympanic membrane is bulging (Clear fluid). Tympanic membrane is not injected or erythematous.     Left Ear:  External ear normal. There is impacted cerumen.     Nose: Rhinorrhea present. No nasal deformity, septal deviation, signs of injury, nasal tenderness, mucosal edema or congestion. Rhinorrhea is clear.     Right Nostril: Occlusion present. No foreign body, epistaxis or septal hematoma.     Left Nostril: Occlusion present. No foreign body, epistaxis or septal hematoma.     Right Turbinates: Enlarged, swollen and pale.     Left Turbinates: Enlarged, swollen and pale.     Right Sinus: No maxillary sinus tenderness or frontal sinus tenderness.     Left Sinus: No maxillary sinus tenderness or frontal sinus tenderness.     Mouth/Throat:     Lips: Pink. No lesions.     Mouth: Mucous membranes are moist. No oral lesions.     Pharynx: Oropharynx is clear. Uvula midline. No posterior oropharyngeal erythema or uvula swelling.     Tonsils: No tonsillar exudate. 0 on the right. 0 on the left.     Comments: Postnasal drip Eyes:     General: Lids are normal.        Right eye: No discharge.         Left eye: No discharge.     Extraocular Movements: Extraocular movements intact.     Conjunctiva/sclera: Conjunctivae normal.     Right eye: Right conjunctiva is not injected.     Left eye: Left conjunctiva is not injected.  Neck:     Trachea: Trachea and phonation normal.  Cardiovascular:     Rate and Rhythm: Normal rate and regular rhythm.     Pulses: Normal pulses.     Heart sounds: Normal heart sounds. No murmur heard.    No friction rub. No gallop.  Pulmonary:     Effort: Pulmonary effort is normal. No accessory muscle usage, prolonged expiration or respiratory distress.     Breath sounds: Normal breath sounds. No stridor, decreased air movement or transmitted upper airway sounds. No decreased breath sounds, wheezing, rhonchi or rales.  Chest:     Chest wall: No tenderness.  Musculoskeletal:        General: Normal range of motion.     Cervical back: Normal range of motion and neck supple. Normal range of motion.  Lymphadenopathy:     Cervical: No cervical adenopathy.  Skin:    General: Skin is warm and dry.     Findings: No erythema or rash.  Neurological:     General: No focal deficit present.     Mental Status: He is alert and oriented to person, place, and time.  Psychiatric:        Mood and Affect: Mood normal.        Behavior: Behavior normal.     Visual Acuity Right Eye Distance:   Left Eye Distance:   Bilateral Distance:    Right Eye Near:   Left Eye Near:    Bilateral Near:     [facility] Couse / Diagnostics / Procedures:     Radiology No results found.  Procedures Ear Cerumen Removal  Date/Time: 05/26/2022 4:26 PM  Performed by: Rexene Edison, RN Authorized by: Theadora Rama Scales, PA-C   Consent:    Consent obtained:  Verbal   Consent given by:  Patient   Risks, benefits, and alternatives were discussed: yes     Risks discussed:  Bleeding, infection, pain, TM perforation, incomplete removal and dizziness   Alternatives discussed:  No  treatment, delayed treatment, alternative treatment, referral and observation Universal protocol:  Procedure explained and questions answered to patient or proxy's satisfaction: yes     Patient identity confirmed:  Verbally with patient and arm band Procedure details:    Location:  L ear   Procedure type: irrigation     Procedure outcomes: cerumen removed   Post-procedure details:    Inspection:  No bleeding, ear canal clear and TM intact   Hearing quality:  Normal   Procedure completion:  Tolerated  (including critical care time) EKG  Pending results:  Labs Reviewed - No data to display  Medications Ordered in UC: Medications - No data to display  UC Diagnoses / Final Clinical Impressions(s)   I have reviewed the triage vital signs and the nursing notes.  Pertinent labs & imaging results that were available during my care of the patient were reviewed by me and considered in my medical decision making (see chart for details).    Final diagnoses:  Cerumen in auditory canal on examination  Seasonal allergic rhinitis, unspecified trigger  Non-recurrent acute suppurative otitis media of left ear without spontaneous rupture of tympanic membrane   Repeat examination of left ear revealed erythema, injection and retraction of left tympanic membrane along with suppurative middle ear effusion.  Patient provided with a prescription for cefdinir for empiric treatment of presumed bacterial left otitis media.  Based on physical exam findings, patient was advised to resume allergy medications, Zyrtec and Flonase were refilled for him. Please see discharge instructions below for further details of plan of care as provided to patient. ED Prescriptions     Medication Sig Dispense Auth. Provider   cetirizine (ZYRTEC ALLERGY) 10 MG tablet Take 1 tablet (10 mg total) by mouth at bedtime. 90 tablet Theadora Rama Scales, PA-C   fluticasone (FLONASE) 50 MCG/ACT nasal spray Place 1 spray into both  nostrils daily. Begin by using 2 sprays in each nare daily for 3 to 5 days, then decrease to 1 spray in each nare daily. 15.8 mL Theadora Rama Scales, PA-C   cefdinir (OMNICEF) 300 MG capsule Take 1 capsule (300 mg total) by mouth 2 (two) times daily for 10 days. 20 capsule Theadora Rama Scales, PA-C      PDMP not reviewed this encounter.  Disposition Upon Discharge:  Condition: stable for discharge home Home: take medications as prescribed; routine discharge instructions as discussed; follow up as advised.  Patient presented with an acute illness with associated systemic symptoms and significant discomfort requiring urgent management. In my opinion, this is a condition that a prudent lay person (someone who possesses an average knowledge of health and medicine) may potentially expect to result in complications if not addressed urgently such as respiratory distress, impairment of bodily function or dysfunction of bodily organs.   Routine symptom specific, illness specific and/or disease specific instructions were discussed with the patient and/or caregiver at length.   As such, the patient has been evaluated and assessed, work-up was performed and treatment was provided in alignment with urgent care protocols and evidence based medicine.  Patient/parent/caregiver has been advised that the patient may require follow up for further testing and treatment if the symptoms continue in spite of treatment, as clinically indicated and appropriate.  If the patient was tested for COVID-19, Influenza and/or RSV, then the patient/parent/guardian was advised to isolate at home pending the results of his/her diagnostic coronavirus test and potentially longer if they're positive. I have also advised pt that if his/her COVID-19 test returns positive, it's recommended to self-isolate for at least 10 days  after symptoms first appeared AND until fever-free for 24 hours without fever reducer AND other symptoms have  improved or resolved. Discussed self-isolation recommendations as well as instructions for household member/close contacts as per the [facility] and [place] [employer], and also gave patient the COVID packet with this information.  Patient/parent/caregiver has been advised to return to the [facility] or PCP in 3-5 days if no better; to PCP or the Emergency Department if new signs and symptoms develop, or if the current signs or symptoms continue to change or worsen for further workup, evaluation and treatment as clinically indicated and appropriate  The patient will follow up with their current PCP if and as advised. If the patient does not currently have a PCP we will assist them in obtaining one.   The patient may need specialty follow up if the symptoms continue, in spite of conservative treatment and management, for further workup, evaluation, consultation and treatment as clinically indicated and appropriate.  Patient/parent/caregiver verbalized understanding and agreement of plan as discussed.  All questions were addressed during visit.  Please see discharge instructions below for further details of plan.  Discharge Instructions:   Discharge Instructions      Please read below to learn more about the medications, dosages and frequencies that I recommend to help alleviate your symptoms and to get you feeling better soon:   Omnicef (cefdinir): To treat the bacterial infection in your left inner ear, please take 1 capsule twice daily for 10 days, you can take it with or without food.  This antibiotic can cause upset stomach which will resolve once antibiotics are complete.  You are welcome to use a probiotic, eat yogurt, take Imodium while taking this medication.  Please avoid other systemic medications such as Maalox, Pepto-Bismol or milk of magnesia as they can interfere with your body's ability to absorb the antibiotics.       Zyrtec (cetirizine): To treat your allergies, I have renewed your prescription for  Zyrtec.  This is an excellent second-generation antihistamine that helps to reduce respiratory inflammatory response to environmental allergens.  In some patients, this medication can cause daytime sleepiness so I recommend that you take 1 tablet daily at bedtime.     Flonase (fluticasone): This is a steroid nasal spray that you use once daily, 1 spray in each nare.  Using Flonase in addition to Zyrtec provides excellent relief of allergy symptoms and reduces your risk of complications from uncontrolled allergies such as inner ear infections.  This medication does not work well if you decide to use it only used as you feel you need to, it works best used on a daily basis.  After 3 to 5 days of use, you will notice significant reduction of the inflammation and mucus production that is currently being caused by exposure to allergens, whether seasonal or environmental.  The most common side effect of this medication is nosebleeds.  If you experience a nosebleed, please discontinue use for 1 week, then feel free to resume.        Please follow-up within the next 7-10 days either with your primary care provider or urgent care if your symptoms do not resolve.  If you do not have a primary care provider, we will assist you in finding one.        Thank you for visiting urgent care today.  We appreciate the opportunity to participate in your care.       This office note has been dictated using Dragon speech recognition  software.  Unfortunately, this method of dictation can sometimes lead to typographical or grammatical errors.  I apologize for your inconvenience in advance if this occurs.  Please do not hesitate to reach out to me if clarification is needed.      Theadora RamaMorgan, Ameera Tigue Scales, PA-C 05/26/22 1636

## 2022-05-26 NOTE — ED Triage Notes (Signed)
Pt reports pain in eft era and feel clogged since ts morning after using a sinus cleanser.

## 2022-05-26 NOTE — Discharge Instructions (Addendum)
Please read below to learn more about the medications, dosages and frequencies that I recommend to help alleviate your symptoms and to get you feeling better soon:   Omnicef (cefdinir): To treat the bacterial infection in your left inner ear, please take 1 capsule twice daily for 10 days, you can take it with or without food.  This antibiotic can cause upset stomach which will resolve once antibiotics are complete.  You are welcome to use a probiotic, eat yogurt, take Imodium while taking this medication.  Please avoid other systemic medications such as Maalox, Pepto-Bismol or milk of magnesia as they can interfere with your body's ability to absorb the antibiotics.       Zyrtec (cetirizine): To treat your allergies, I have renewed your prescription for Zyrtec.  This is an excellent second-generation antihistamine that helps to reduce respiratory inflammatory response to environmental allergens.  In some patients, this medication can cause daytime sleepiness so I recommend that you take 1 tablet daily at bedtime.     Flonase (fluticasone): This is a steroid nasal spray that you use once daily, 1 spray in each nare.  Using Flonase in addition to Zyrtec provides excellent relief of allergy symptoms and reduces your risk of complications from uncontrolled allergies such as inner ear infections.  This medication does not work well if you decide to use it only used as you feel you need to, it works best used on a daily basis.  After 3 to 5 days of use, you will notice significant reduction of the inflammation and mucus production that is currently being caused by exposure to allergens, whether seasonal or environmental.  The most common side effect of this medication is nosebleeds.  If you experience a nosebleed, please discontinue use for 1 week, then feel free to resume.        Please follow-up within the next 7-10 days either with your primary care provider or urgent care if your symptoms do not resolve.  If  you do not have a primary care provider, we will assist you in finding one.        Thank you for visiting urgent care today.  We appreciate the opportunity to participate in your care.

## 2023-07-08 ENCOUNTER — Other Ambulatory Visit: Payer: Self-pay

## 2023-07-08 ENCOUNTER — Encounter (HOSPITAL_BASED_OUTPATIENT_CLINIC_OR_DEPARTMENT_OTHER): Payer: Self-pay

## 2023-07-08 ENCOUNTER — Emergency Department (HOSPITAL_BASED_OUTPATIENT_CLINIC_OR_DEPARTMENT_OTHER): Payer: Medicaid Other | Admitting: Radiology

## 2023-07-08 ENCOUNTER — Emergency Department (HOSPITAL_BASED_OUTPATIENT_CLINIC_OR_DEPARTMENT_OTHER)
Admission: EM | Admit: 2023-07-08 | Discharge: 2023-07-08 | Disposition: A | Discharge status: Home / Self Care | Payer: Medicaid Other | Attending: Emergency Medicine | Admitting: Emergency Medicine

## 2023-07-08 DIAGNOSIS — R0981 Nasal congestion: Secondary | ICD-10-CM | POA: Diagnosis present

## 2023-07-08 DIAGNOSIS — Z1152 Encounter for screening for COVID-19: Secondary | ICD-10-CM | POA: Diagnosis not present

## 2023-07-08 DIAGNOSIS — J101 Influenza due to other identified influenza virus with other respiratory manifestations: Secondary | ICD-10-CM | POA: Diagnosis not present

## 2023-07-08 LAB — RESP PANEL BY RT-PCR (RSV, FLU A&B, COVID)  RVPGX2
Influenza A by PCR: POSITIVE — AB
Influenza B by PCR: NEGATIVE
Resp Syncytial Virus by PCR: NEGATIVE
SARS Coronavirus 2 by RT PCR: NEGATIVE

## 2023-07-08 MED ORDER — BENZONATATE 100 MG PO CAPS: 100.0000 mg | ORAL_CAPSULE | Freq: Four times a day (QID) | ORAL | 0 refills | Status: AC | PRN

## 2023-07-08 MED ORDER — DEXAMETHASONE 4 MG PO TABS
10.0000 mg | ORAL_TABLET | Freq: Once | ORAL | Status: AC
Start: 2023-07-08 — End: 2023-07-08
  Administered 2023-07-08: 10 mg via ORAL
  Filled 2023-07-08: qty 3

## 2023-07-08 NOTE — ED Notes (Signed)
 RN reviewed discharge instructions with pt. Pt verbalized understanding and had no further questions. VSS upon discharge.  

## 2023-07-08 NOTE — ED Provider Notes (Signed)
[place] EMERGENCY DEPARTMENT AT [facility] Provider Note   CSN: [id number] Arrival date & time: 07/08/23  1253     History  Chief Complaint  Patient presents with   flu symptoms    Philip Baird is a 28 y.o. male.  Patient complains of flulike symptoms.  Patient reports that he has a cough and congestion.  Patient reports feeling like it is harder to breathe today.  Patient reports that he had a fever 2 days ago he has not had a fever today.  Patient denies any nausea or vomiting.  Patient does not have any medical problems he is not currently on any medications.  The history is provided by the patient.       Home Medications Prior to Admission medications   Medication Sig Start Date End Date Taking? Authorizing Provider  cetirizine (ZYRTEC ALLERGY) 10 MG tablet Take 1 tablet (10 mg total) by mouth at bedtime. 05/26/22 11/22/22  Theadora Rama Scales, PA-C  fluticasone (FLONASE) 50 MCG/ACT nasal spray Place 1 spray into both nostrils daily. Begin by using 2 sprays in each nare daily for 3 to 5 days, then decrease to 1 spray in each nare daily. 05/26/22   Theadora Rama Scales, PA-C      Allergies    Patient has no known allergies.    Review of Systems   Review of Systems  Respiratory:  Positive for cough.   All other systems reviewed and are negative.   Physical Exam Updated Vital Signs BP 124/75 (BP Location: Left Arm)   Pulse (!) 102   Temp 98.3 F (36.8 C) (Oral)   Resp 17   Ht 5\' 11"  (1.803 m)   Wt 122.5 kg   SpO2 95%   BMI 37.66 kg/m  Physical Exam Vitals and nursing note reviewed.  Constitutional:      Appearance: He is well-developed.  HENT:     Head: Normocephalic.     Mouth/Throat:     Mouth: Mucous membranes are moist.  Cardiovascular:     Rate and Rhythm: Normal rate.  Pulmonary:     Effort: Pulmonary effort is normal.  Abdominal:     General: There is no distension.  Musculoskeletal:        General: Normal range of motion.      Cervical back: Normal range of motion.  Skin:    General: Skin is warm.  Neurological:     General: No focal deficit present.     Mental Status: He is alert and oriented to person, place, and time.  Psychiatric:        Mood and Affect: Mood normal.     ED Results / Procedures / Treatments   Labs (all labs ordered are listed, but only abnormal results are displayed) Labs Reviewed  RESP PANEL BY RT-PCR (RSV, FLU A&B, COVID)  [id number] - Abnormal; Notable for the following components:      Result Value   Influenza A by PCR POSITIVE (*)    All other components within normal limits    EKG None  Radiology DG Chest 2 View Result Date: 07/08/2023 CLINICAL DATA:  Shortness of breath EXAM: CHEST - 2 VIEW COMPARISON:  01/28/2017 FINDINGS: The heart size and mediastinal contours are within normal limits. Both lungs are clear. The visualized skeletal structures are unremarkable. IMPRESSION: No active cardiopulmonary disease. Electronically Signed   By: Judie Petit.  Shick M.D.   On: 07/08/2023 13:45    Procedures Procedures    Medications Ordered in ED  Medications - No data to display  ED Course/ Medical Decision Making/ A&P                                 Medical Decision Making Patient complains of a cough and difficulty breathing.  Amount and/or Complexity of Data Reviewed Labs: ordered. Decision-making details documented in ED Course.    Details: Labs ordered reviewed and interpreted influenza A is positive Radiology: ordered and independent interpretation performed.    Details: Chest x-ray ordered reviewed and interpreted chest x-ray shows no evidence of pneumonia.  Risk OTC drugs. Risk Details: Patient counseled on findings.  Patient is beyond the time that Tamiflu would be beneficial.  Patient is given a dose of Decadron here.  He is given a prescription for [employer].  Patient advised to follow-up with his primary care physician for recheck.           Final  Clinical Impression(s) / ED Diagnoses Final diagnoses:  Influenza A    Rx / DC Orders ED Discharge Orders     None     An After Visit Summary was printed and given to the patient.     Elson Areas, PA-C 07/08/23 1455    Virgina Norfolk, DO 07/11/23 [phone]

## 2023-07-08 NOTE — ED Triage Notes (Signed)
Patient arrives POV from home c/o flu symptoms. Patient states he went to the [facility] urgent care on Friday 07/03/23 and tested positive for the flu. Patient states he was given a 5 day course of tamiflu and was told to not continue taking medication if he vomited; patient states he took 1 day's worth and stopped medication due to the vomiting. Patient reports no improvement in symptoms.

## 2023-07-08 NOTE — ED Notes (Signed)
ED Provider at bedside. 

## 2023-08-20 ENCOUNTER — Other Ambulatory Visit: Payer: Self-pay

## 2023-08-20 DIAGNOSIS — F17290 Nicotine dependence, other tobacco product, uncomplicated: Secondary | ICD-10-CM | POA: Diagnosis not present

## 2023-08-20 DIAGNOSIS — R519 Headache, unspecified: Secondary | ICD-10-CM | POA: Insufficient documentation

## 2023-08-20 DIAGNOSIS — M25512 Pain in left shoulder: Secondary | ICD-10-CM | POA: Diagnosis not present

## 2023-08-20 DIAGNOSIS — M545 Low back pain, unspecified: Secondary | ICD-10-CM | POA: Insufficient documentation

## 2023-08-20 DIAGNOSIS — M542 Cervicalgia: Secondary | ICD-10-CM | POA: Insufficient documentation
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
## 2023-08-21 ENCOUNTER — Emergency Department (HOSPITAL_BASED_OUTPATIENT_CLINIC_OR_DEPARTMENT_OTHER)
Admission: EM | Admit: 2023-08-21 | Discharge: 2023-08-21 | Disposition: A | Discharge status: Home / Self Care | Attending: Emergency Medicine | Admitting: Emergency Medicine

## 2023-08-21 ENCOUNTER — Emergency Department (HOSPITAL_BASED_OUTPATIENT_CLINIC_OR_DEPARTMENT_OTHER)

## 2023-08-21 ENCOUNTER — Emergency Department (HOSPITAL_BASED_OUTPATIENT_CLINIC_OR_DEPARTMENT_OTHER): Admitting: Radiology

## 2023-08-21 MED ORDER — PROCHLORPERAZINE MALEATE 10 MG PO TABS
10.0000 mg | ORAL_TABLET | Freq: Once | ORAL | Status: AC
  Administered 2023-08-21: 10 mg via ORAL
  Filled 2023-08-21: qty 1

## 2023-08-21 MED ORDER — ACETAMINOPHEN 500 MG PO TABS
1000.0000 mg | ORAL_TABLET | Freq: Once | ORAL | Status: AC
Start: 2023-08-21 — End: 2023-08-21
  Administered 2023-08-21: 1000 mg via ORAL
  Filled 2023-08-21: qty 2
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
